# Patient Record
Sex: Female | Born: 1950 | Race: Black or African American | Hispanic: No | Smoking: Never smoker
Health system: Southern US, Community
[De-identification: ages and names within clinical notes are randomized; demographics above are authoritative.]

## PROBLEM LIST (undated history)

## (undated) DIAGNOSIS — E785 Hyperlipidemia, unspecified: Secondary | ICD-10-CM

## (undated) HISTORY — PX: KIDNEY DONATION: SHX685

## (undated) HISTORY — PX: OTHER SURGICAL HISTORY: SHX169

---

## 1999-04-03 ENCOUNTER — Encounter: Payer: Self-pay | Admitting: *Deleted

## 1999-04-03 ENCOUNTER — Encounter: Admission: RE | Admit: 1999-04-03 | Discharge: 1999-04-03 | Payer: Self-pay | Admitting: *Deleted

## 1999-04-21 ENCOUNTER — Encounter: Payer: Self-pay | Admitting: *Deleted

## 1999-04-21 ENCOUNTER — Encounter: Admission: RE | Admit: 1999-04-21 | Discharge: 1999-04-21 | Payer: Self-pay | Admitting: *Deleted

## 2000-03-22 ENCOUNTER — Other Ambulatory Visit: Admission: RE | Admit: 2000-03-22 | Discharge: 2000-03-22 | Payer: Self-pay | Admitting: *Deleted

## 2000-04-07 ENCOUNTER — Encounter: Admission: RE | Admit: 2000-04-07 | Discharge: 2000-04-07 | Payer: Self-pay | Admitting: *Deleted

## 2000-04-07 ENCOUNTER — Encounter: Payer: Self-pay | Admitting: *Deleted

## 2001-04-11 ENCOUNTER — Other Ambulatory Visit: Admission: RE | Admit: 2001-04-11 | Discharge: 2001-04-11 | Payer: Self-pay | Admitting: Obstetrics and Gynecology

## 2001-04-11 ENCOUNTER — Encounter: Admission: RE | Admit: 2001-04-11 | Discharge: 2001-04-11 | Payer: Self-pay | Admitting: *Deleted

## 2001-04-11 ENCOUNTER — Encounter: Payer: Self-pay | Admitting: *Deleted

## 2002-02-22 ENCOUNTER — Encounter: Payer: Self-pay | Admitting: Internal Medicine

## 2002-02-22 ENCOUNTER — Encounter: Admission: RE | Admit: 2002-02-22 | Discharge: 2002-02-22 | Payer: Self-pay | Admitting: Internal Medicine

## 2003-05-07 ENCOUNTER — Ambulatory Visit (HOSPITAL_COMMUNITY): Admission: RE | Admit: 2003-05-07 | Discharge: 2003-05-07 | Payer: Self-pay | Admitting: Internal Medicine

## 2003-06-11 ENCOUNTER — Encounter: Admission: RE | Admit: 2003-06-11 | Discharge: 2003-06-11 | Payer: Self-pay | Admitting: Internal Medicine

## 2004-03-03 ENCOUNTER — Encounter: Payer: Self-pay | Admitting: Internal Medicine

## 2004-03-13 ENCOUNTER — Encounter: Payer: Self-pay | Admitting: Internal Medicine

## 2004-04-13 ENCOUNTER — Encounter: Payer: Self-pay | Admitting: Internal Medicine

## 2004-09-18 ENCOUNTER — Encounter: Admission: RE | Admit: 2004-09-18 | Discharge: 2004-09-18 | Payer: Self-pay | Admitting: Internal Medicine

## 2004-10-28 ENCOUNTER — Ambulatory Visit: Payer: Self-pay

## 2005-06-02 ENCOUNTER — Ambulatory Visit: Payer: Self-pay | Admitting: Orthopedic Surgery

## 2005-10-26 ENCOUNTER — Encounter: Admission: RE | Admit: 2005-10-26 | Discharge: 2005-10-26 | Payer: Self-pay | Admitting: Family Medicine

## 2006-02-02 ENCOUNTER — Encounter
Admission: RE | Admit: 2006-02-02 | Discharge: 2006-03-16 | Payer: Self-pay | Admitting: Physical Medicine & Rehabilitation

## 2006-12-27 ENCOUNTER — Encounter: Admission: RE | Admit: 2006-12-27 | Discharge: 2006-12-27 | Payer: Self-pay | Admitting: Family Medicine

## 2006-12-31 ENCOUNTER — Ambulatory Visit (HOSPITAL_COMMUNITY): Admission: RE | Admit: 2006-12-31 | Discharge: 2006-12-31 | Payer: Self-pay | Admitting: Family Medicine

## 2008-04-21 ENCOUNTER — Emergency Department (HOSPITAL_COMMUNITY): Admission: EM | Admit: 2008-04-21 | Discharge: 2008-04-21 | Payer: Self-pay | Admitting: Family Medicine

## 2009-03-12 ENCOUNTER — Emergency Department (HOSPITAL_COMMUNITY): Admission: EM | Admit: 2009-03-12 | Discharge: 2009-03-12 | Payer: Self-pay | Admitting: Family Medicine

## 2009-07-03 ENCOUNTER — Encounter: Admission: RE | Admit: 2009-07-03 | Discharge: 2009-07-03 | Payer: Self-pay | Admitting: Family Medicine

## 2010-05-04 ENCOUNTER — Encounter: Payer: Self-pay | Admitting: Family Medicine

## 2011-03-21 ENCOUNTER — Encounter: Payer: Self-pay | Admitting: *Deleted

## 2011-03-21 ENCOUNTER — Emergency Department (HOSPITAL_COMMUNITY): Payer: PRIVATE HEALTH INSURANCE

## 2011-03-21 ENCOUNTER — Emergency Department (HOSPITAL_COMMUNITY)
Admission: EM | Admit: 2011-03-21 | Discharge: 2011-03-21 | Disposition: A | Discharge status: Home / Self Care | Payer: PRIVATE HEALTH INSURANCE | Attending: Emergency Medicine | Admitting: Emergency Medicine

## 2011-03-21 DIAGNOSIS — B9789 Other viral agents as the cause of diseases classified elsewhere: Secondary | ICD-10-CM | POA: Insufficient documentation

## 2011-03-21 DIAGNOSIS — B349 Viral infection, unspecified: Secondary | ICD-10-CM

## 2011-03-21 DIAGNOSIS — J029 Acute pharyngitis, unspecified: Secondary | ICD-10-CM | POA: Insufficient documentation

## 2011-03-21 HISTORY — DX: Hyperlipidemia, unspecified: E78.5

## 2011-03-21 MED ORDER — ACETAMINOPHEN 325 MG PO TABS
650.0000 mg | ORAL_TABLET | Freq: Once | ORAL | Status: AC
  Administered 2011-03-21: 650 mg via ORAL
  Filled 2011-03-21: qty 2

## 2011-03-21 MED ORDER — HYDROCOD POLST-CHLORPHEN POLST 10-8 MG/5ML PO LQCR: 5.0000 mL | Freq: Two times a day (BID) | ORAL | Status: DC | PRN

## 2011-03-21 NOTE — ED Provider Notes (Signed)
History     CSN: [id number] Arrival date & time: 03/21/2011  6:52 PM   First MD Initiated Contact with Patient 03/21/11 1928      Chief Complaint  Patient presents with  . Cough  . Sore Throat    (Consider location/radiation/quality/duration/timing/severity/associated sxs/prior treatment) HPI Comments: Patient has been seen by her PCP for this same complaint.  She was prescribed Jerilynn Som, which she reports provided her  minimal relief.  Patient reports that she was sent to the ED by her PCP to get a chest xray.    Patient is a 60 y.o. female presenting with cough. The history is provided by the patient.  Cough This is a new problem. The current episode started more than 1 week ago. The problem has been gradually worsening. The cough is non-productive. The maximum temperature recorded prior to her arrival was 100 to 100.9 F. Associated symptoms include rhinorrhea, sore throat and myalgias. Pertinent negatives include no chest pain, no chills, no weight loss, no ear congestion, no ear pain, no headaches, no shortness of breath, no wheezing and no eye redness. She is not a smoker. Her past medical history does not include COPD or asthma.    Past Medical History  Diagnosis Date  . Hyperlipidemia     LDL elevated    Past Surgical History  Procedure Date  . Kidney donation   . Carpel tunnell release     No family history on file.  History  Substance Use Topics  . Smoking status: Never Smoker   . Smokeless tobacco: Not on file  . Alcohol Use: No    OB History    Grav Para Term Preterm Abortions TAB SAB Ect Mult Living                  Review of Systems  Constitutional: Positive for appetite change and fatigue. Negative for chills, weight loss, diaphoresis and unexpected weight change.  HENT: Positive for congestion, sore throat and rhinorrhea. Negative for ear pain, neck pain and neck stiffness.   Eyes: Negative for redness and visual disturbance.  Respiratory:  Positive for cough. Negative for chest tightness, shortness of breath and wheezing.   Cardiovascular: Negative for chest pain.  Gastrointestinal: Negative for nausea, vomiting and abdominal pain.  Genitourinary: Negative for dysuria, hematuria, decreased urine volume, vaginal bleeding and vaginal discharge.  Musculoskeletal: Positive for myalgias.  Skin: Negative for rash.  Neurological: Negative for dizziness, syncope, light-headedness and headaches.  Psychiatric/Behavioral: Negative for confusion.    Allergies  Review of patient's allergies indicates no known allergies.  Home Medications   Current Outpatient Rx  Name Route Sig Dispense Refill  . THERA M PLUS PO TABS Oral Take 1 tablet by mouth daily.        BP 130/88  Pulse 100  Temp(Src) 99.7 F (37.6 C) (Oral)  Resp 16  SpO2 100%  Physical Exam  Constitutional: She is oriented to person, place, and time. She appears well-developed and well-nourished.  HENT:  Head: Normocephalic and atraumatic.  Eyes: EOM are normal. Pupils are equal, round, and reactive to light.  Neck: Normal range of motion. Neck supple.  Cardiovascular: Normal rate, regular rhythm and normal heart sounds.   Pulmonary/Chest: Effort normal and breath sounds normal. No respiratory distress. She has no wheezes. She has no rales. She exhibits no tenderness.  Abdominal: Soft. Bowel sounds are normal. She exhibits no distension. There is no tenderness.  Musculoskeletal: Normal range of motion.  Neurological: She is alert  and oriented to person, place, and time.  Skin: Skin is warm and dry.  Psychiatric: She has a normal mood and affect.    ED Course  Procedures (including critical care time)  Labs Reviewed - No data to display Dg Chest 2 View  03/21/2011  *RADIOLOGY REPORT*  Clinical Data: Cough and fever.  CHEST - 2 VIEW  Comparison: None.  Findings: The heart size is normal.  The lungs are clear.  The visualized soft tissues and bony thorax are  unremarkable.  IMPRESSION: Negative chest.  Original Report Authenticated By: Jamesetta Orleans. MATTERN, M.D.     1. Viral illness       MDM  Negative CXR..  VSS.  Patient is not hypoxic.  No respiratory distress.  Therefore, feel that patient can be safely discharged home with PCP follow up.  Patient given a Rx for Tussinex for the cough.  Patient in agreement with the plan.         Pascal Lux [facility] 03/22/11 1325

## 2011-03-21 NOTE — ED Notes (Signed)
Pt has had cough for greater than 3 weeks. Saw MD at work who gave her Tessalon pearls with some improvement. Suggested CXR. Pt does not have PCP, comes to [facility] ED for treatment. Also notes scratchy through along with cough.

## 2011-03-22 NOTE — ED Provider Notes (Signed)
Medical screening examination/treatment/procedure(s) were performed by non-physician practitioner and as supervising physician I was immediately available for consultation/collaboration.  Nandan Willems P Izela Altier, MD 03/22/11 1528 

## 2011-11-18 ENCOUNTER — Emergency Department: Payer: Self-pay | Admitting: Emergency Medicine

## 2011-11-18 LAB — COMPREHENSIVE METABOLIC PANEL
Bilirubin,Total: 0.9 mg/dL (ref 0.2–1.0)
Chloride: 108 mmol/L — ABNORMAL HIGH (ref 98–107)
Co2: 26 mmol/L (ref 21–32)
Creatinine: 1 mg/dL (ref 0.60–1.30)
EGFR (African American): >60
EGFR (Non-African Amer.): >60
SGOT(AST): 42 U/L — ABNORMAL HIGH (ref 15–37)
SGPT (ALT): 28 U/L (ref 12–78)

## 2011-11-18 LAB — CBC
HCT: 39.4 % (ref 35.0–47.0)
MCV: 84 fL (ref 80–100)
Platelet: 255 10*3/uL (ref 150–440)
RBC: 4.7 10*6/uL (ref 3.80–5.20)
WBC: 16.8 10*3/uL — ABNORMAL HIGH (ref 3.6–11.0)

## 2011-11-18 LAB — APTT: Activated PTT: 29.1 secs (ref 23.6–35.9)

## 2012-06-16 ENCOUNTER — Ambulatory Visit: Payer: PRIVATE HEALTH INSURANCE | Attending: Student | Admitting: Physical Therapy

## 2012-06-16 DIAGNOSIS — M25569 Pain in unspecified knee: Secondary | ICD-10-CM | POA: Insufficient documentation

## 2012-06-16 DIAGNOSIS — IMO0001 Reserved for inherently not codable concepts without codable children: Secondary | ICD-10-CM | POA: Insufficient documentation

## 2012-06-21 ENCOUNTER — Ambulatory Visit: Payer: PRIVATE HEALTH INSURANCE | Admitting: Physical Therapy

## 2012-06-22 ENCOUNTER — Ambulatory Visit: Payer: PRIVATE HEALTH INSURANCE | Admitting: Physical Therapy

## 2012-06-28 ENCOUNTER — Ambulatory Visit: Payer: PRIVATE HEALTH INSURANCE | Admitting: Physical Therapy

## 2012-06-30 ENCOUNTER — Ambulatory Visit: Payer: PRIVATE HEALTH INSURANCE | Admitting: Physical Therapy

## 2012-07-04 ENCOUNTER — Ambulatory Visit: Payer: PRIVATE HEALTH INSURANCE | Admitting: Physical Therapy

## 2012-07-05 ENCOUNTER — Ambulatory Visit: Payer: PRIVATE HEALTH INSURANCE | Admitting: Physical Therapy

## 2012-07-12 ENCOUNTER — Ambulatory Visit: Payer: PRIVATE HEALTH INSURANCE | Attending: Student | Admitting: Physical Therapy

## 2012-07-12 DIAGNOSIS — M25569 Pain in unspecified knee: Secondary | ICD-10-CM | POA: Insufficient documentation

## 2012-07-12 DIAGNOSIS — IMO0001 Reserved for inherently not codable concepts without codable children: Secondary | ICD-10-CM | POA: Insufficient documentation

## 2012-07-14 ENCOUNTER — Ambulatory Visit: Payer: PRIVATE HEALTH INSURANCE | Admitting: Physical Therapy

## 2012-07-19 ENCOUNTER — Ambulatory Visit: Payer: PRIVATE HEALTH INSURANCE | Admitting: Physical Therapy

## 2012-07-21 ENCOUNTER — Ambulatory Visit: Payer: PRIVATE HEALTH INSURANCE | Admitting: Physical Therapy

## 2012-07-26 ENCOUNTER — Ambulatory Visit: Payer: PRIVATE HEALTH INSURANCE | Admitting: Physical Therapy

## 2012-07-28 ENCOUNTER — Ambulatory Visit: Payer: PRIVATE HEALTH INSURANCE | Admitting: Physical Therapy

## 2012-10-06 ENCOUNTER — Ambulatory Visit: Payer: PRIVATE HEALTH INSURANCE | Attending: Student | Admitting: Physical Therapy

## 2012-10-06 DIAGNOSIS — IMO0001 Reserved for inherently not codable concepts without codable children: Secondary | ICD-10-CM | POA: Insufficient documentation

## 2012-10-06 DIAGNOSIS — M25569 Pain in unspecified knee: Secondary | ICD-10-CM | POA: Insufficient documentation

## 2012-10-18 ENCOUNTER — Ambulatory Visit: Payer: PRIVATE HEALTH INSURANCE | Attending: Student | Admitting: Physical Therapy

## 2012-10-18 DIAGNOSIS — IMO0001 Reserved for inherently not codable concepts without codable children: Secondary | ICD-10-CM | POA: Insufficient documentation

## 2012-10-18 DIAGNOSIS — M25569 Pain in unspecified knee: Secondary | ICD-10-CM | POA: Insufficient documentation

## 2012-10-20 ENCOUNTER — Ambulatory Visit: Payer: PRIVATE HEALTH INSURANCE | Admitting: Physical Therapy

## 2012-10-25 ENCOUNTER — Ambulatory Visit: Payer: PRIVATE HEALTH INSURANCE | Admitting: Physical Therapy

## 2012-10-27 ENCOUNTER — Ambulatory Visit: Payer: PRIVATE HEALTH INSURANCE | Admitting: Physical Therapy

## 2012-11-01 ENCOUNTER — Encounter: Payer: PRIVATE HEALTH INSURANCE | Admitting: Physical Therapy

## 2012-11-03 ENCOUNTER — Encounter: Payer: PRIVATE HEALTH INSURANCE | Admitting: Physical Therapy

## 2016-06-03 ENCOUNTER — Ambulatory Visit: Payer: Self-pay | Admitting: Physician Assistant

## 2016-06-03 ENCOUNTER — Encounter: Payer: Self-pay | Admitting: Physician Assistant

## 2016-06-03 VITALS — BP 150/80 | HR 90 | Temp 98.6°F

## 2016-06-03 DIAGNOSIS — J069 Acute upper respiratory infection, unspecified: Secondary | ICD-10-CM

## 2016-06-03 MED ORDER — AZITHROMYCIN 250 MG PO TABS: ORAL_TABLET | ORAL | 0 refills | Status: DC

## 2016-06-03 MED ORDER — BENZONATATE 200 MG PO CAPS: 200.0000 mg | ORAL_CAPSULE | Freq: Two times a day (BID) | ORAL | 0 refills | Status: DC | PRN

## 2016-06-03 NOTE — Progress Notes (Signed)
S: C/o cough, sore throat and congestion for 3 days, no fever, chills, cp/sob, v/d; mucus was clear and brown this am , cough is dry and hacking, no wheezing   Using otc meds: otc cough med  O: PE: vitals wnl,nad, perrl eomi, normocephalic, tms dull, nasal mucosa red and swollen, throat injected, neck supple no lymph, lungs c t a, cv rrr, neuro intact  A:  Acute uri   P: drink fluids, continue regular meds , use otc meds of choice, return if not improving in 5 days, return earlier if worsening , zpack, tessalon perls

## 2016-06-22 ENCOUNTER — Encounter: Payer: Self-pay | Admitting: Physician Assistant

## 2016-06-22 ENCOUNTER — Ambulatory Visit: Payer: Self-pay | Admitting: Physician Assistant

## 2016-06-22 VITALS — BP 140/90 | HR 60 | Temp 97.9°F

## 2016-06-22 DIAGNOSIS — J069 Acute upper respiratory infection, unspecified: Secondary | ICD-10-CM

## 2016-06-22 MED ORDER — METHYLPREDNISOLONE 4 MG PO TBPK: ORAL_TABLET | ORAL | 0 refills | Status: DC

## 2016-06-22 NOTE — Progress Notes (Signed)
S: c/o continued cough since 2-21; states she finished her zpack but the dry cough won't stop, no fever/chills/cp/sob, no known wheezing, states she gets very little mucus out  O: vitals wnl, nad, ent wnl, neck supple no lymph, lungs c t a, cv rrr  A: acute uri   P: medrol dose pack, if not better in 2 days will rx inhaler and ?antibiotic

## 2016-09-02 ENCOUNTER — Other Ambulatory Visit: Payer: Self-pay | Admitting: Physician Assistant

## 2016-09-03 NOTE — Telephone Encounter (Signed)
Med refill approved 

## 2017-04-19 ENCOUNTER — Ambulatory Visit
Admission: EM | Admit: 2017-04-19 | Discharge: 2017-04-19 | Disposition: A | Discharge status: Home / Self Care | Payer: Self-pay | Attending: Family Medicine | Admitting: Family Medicine

## 2017-04-19 ENCOUNTER — Other Ambulatory Visit: Payer: Self-pay

## 2017-04-19 ENCOUNTER — Encounter: Payer: Self-pay | Admitting: Emergency Medicine

## 2017-04-19 DIAGNOSIS — R05 Cough: Secondary | ICD-10-CM

## 2017-04-19 DIAGNOSIS — J069 Acute upper respiratory infection, unspecified: Secondary | ICD-10-CM

## 2017-04-19 DIAGNOSIS — J029 Acute pharyngitis, unspecified: Secondary | ICD-10-CM

## 2017-04-19 DIAGNOSIS — R059 Cough, unspecified: Secondary | ICD-10-CM

## 2017-04-19 LAB — RAPID STREP SCREEN (MED CTR MEBANE ONLY): STREPTOCOCCUS, GROUP A SCREEN (DIRECT): NEGATIVE

## 2017-04-19 MED ORDER — DOXYCYCLINE HYCLATE 100 MG PO CAPS: 100.0000 mg | ORAL_CAPSULE | Freq: Two times a day (BID) | ORAL | 0 refills | Status: DC

## 2017-04-19 MED ORDER — BENZONATATE 100 MG PO CAPS: 100.0000 mg | ORAL_CAPSULE | Freq: Three times a day (TID) | ORAL | 0 refills | Status: DC | PRN

## 2017-04-19 MED ORDER — GUAIFENESIN-CODEINE 100-10 MG/5ML PO SOLN: 5.0000 mL | Freq: Every evening | ORAL | 0 refills | Status: DC | PRN

## 2017-04-19 NOTE — ED Triage Notes (Signed)
Patient c/o HA, cough, nasal drainage, and chest congestion since Tuesday.  Patient denies fevers.

## 2017-04-19 NOTE — ED Provider Notes (Signed)
[facility] ____________________________________________  Time seen: Approximately 09:12 AM  I have reviewed the triage vital signs and the nursing notes.   HISTORY  Chief Complaint Cough   HPI Barbara Powers is a 67 y.o. female presenting for evaluation of 1 week of nasal congestion, sore throat, cough and chest congestion.  States cough is Occasionally productive, mostly dry hacking cough.  States cough is disrupting her sleep.  States symptoms initially started with sore throat, but reports other symptoms quickly followed.  States sore throat has continued and it does hurt to eat and drink, but continues to overall eat and drink well.  Denies any accompanying fevers.  Denies home sick contacts.  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  States had some chills and warm sensation, denies known fevers.  States of the last 2-3 days has been resting more and symptoms have continued.  States initially she continue to remain very active.  States sore throat currently mild to moderate.  States does have some soreness to her sides from coughing, denies chest pain or shortness of breath.  Denies hemoptysis.  States symptoms have been unresolved with over-the-counter Mucinex and lozenges.  States that she has a previous kidney donor with nephrectomy.  Denies renal insufficiency.  Denies cardiac history.  Denies chest pain, shortness of breath, abdominal pain, dysuria, extremity pain, extremity swelling or rash. Denies recent sickness. Denies recent antibiotic use.    Past Medical History:  Diagnosis Date  . Hyperlipidemia    LDL elevated    There are no active problems to display for this patient.   Past Surgical History:  Procedure Laterality Date  . carpel tunnell release    . KIDNEY DONATION       No current facility-administered medications for this encounter.   Current Outpatient Medications:  .   esomeprazole (NEXIUM) 20 MG capsule, Take 20 mg by mouth daily at 12 noon., Disp: , Rfl:  .  fluticasone (FLONASE) 50 MCG/ACT nasal spray, USE 2 SPRAYS IN EACH NOSTRIL EVERY DAY, Disp: 16 g, Rfl: 12 .  Multiple Vitamins-Minerals (MULTIVITAMINS THER. W/MINERALS) TABS, Take 1 tablet by mouth daily.  , Disp: , Rfl:  .  benzonatate (TESSALON PERLES) 100 MG capsule, Take 1 capsule (100 mg total) by mouth 3 (three) times daily as needed for cough., Disp: 15 capsule, Rfl: 0 .  doxycycline (VIBRAMYCIN) 100 MG capsule, Take 1 capsule (100 mg total) by mouth 2 (two) times daily., Disp: 20 capsule, Rfl: 0 .  guaiFENesin-codeine 100-10 MG/5ML syrup, Take 5 mLs by mouth at bedtime as needed for cough. Do not drive or operate machinery as can cause drowsiness., Disp: 75 mL, Rfl: 0  Allergies Amoxicillin and Latex  History reviewed. No pertinent family history.  Social History Social History   Tobacco Use  . Smoking status: Never Smoker  . Smokeless tobacco: Never Used  Substance Use Topics  . Alcohol use: No  . Drug use: Not on file    Review of Systems Constitutional: No fever/chills ENT: Positive sore throat. Cardiovascular: Denies chest pain. Respiratory: Denies shortness of breath. Gastrointestinal: No abdominal pain.   Musculoskeletal: Negative for back pain. Skin: Negative for rash.  ____________________________________________   PHYSICAL EXAM:  VITAL SIGNS: ED Triage Vitals  Enc Vitals Group     BP 04/19/17 0859 120/60     Pulse Rate 04/19/17 0859 95     Resp 04/19/17 0859 16  Temp 04/19/17 0859 99.2 F (37.3 C)     Temp Source 04/19/17 0859 Oral     SpO2 04/19/17 0859 97 %     Weight 04/19/17 0856 192 lb (87.1 kg)     Height 04/19/17 0856 5\' 6"  (1.676 m)     Head Circumference --      Peak Flow --      Pain Score 04/19/17 0856 7     Pain Loc --      Pain Edu? --      Excl. in GC? --     Constitutional: Alert and oriented. Well appearing and in no acute  distress. Eyes: Conjunctivae are normal. Head: Atraumatic. No sinus tenderness to palpation. No swelling. No erythema.  Ears: no erythema, normal TMs bilaterally.   Nose:Nasal congestion  Mouth/Throat: Mucous membranes are moist. Mild to moderate pharyngeal erythema. No tonsillar swelling or exudate.  Neck: No stridor.  No cervical spine tenderness to palpation. Hematological/Lymphatic/Immunilogical: No cervical lymphadenopathy. Cardiovascular: Normal rate, regular rhythm. Grossly normal heart sounds.  Good peripheral circulation. Respiratory: Normal respiratory effort.  No retractions. No wheezes.  Mild scattered rhonchi.  Dry intermittent cough noted in room.  Speaks in complete sentences.  Good air movement.  Musculoskeletal: Ambulatory with steady gait.  Neurologic:  Normal speech and language. No gait instability. Skin:  Skin appears warm, dry and intact. No rash noted. Psychiatric: Mood and affect are normal. Speech and behavior are normal.  ___________________________________________   LABS (all labs ordered are listed, but only abnormal results are displayed)  Labs Reviewed  RAPID STREP SCREEN (NOT AT [facility])  CULTURE, GROUP A STREP [facility])    PROCEDURES Procedures   INITIAL IMPRESSION / ASSESSMENT AND PLAN / ED COURSE  Pertinent labs & imaging results that were available during my care of the patient were reviewed by me and considered in my medical decision making (see chart for details).  Well-appearing patient.  No acute distress.  Suspect recent viral upper respiratory infection.  Quick strep negative, will culture.  Will start patient on oral doxycycline, as needed guaifenesin with codeine at night and PRN Tessalon Perles.  Encourage rest, fluids, supportive care.Discussed indication, risks and benefits of medications with patient.  Discussed follow up with Primary care physician this week. Discussed follow up and return parameters including no resolution or any worsening  concerns. Patient verbalized understanding and agreed to plan.   ____________________________________________   FINAL CLINICAL IMPRESSION(S) / ED DIAGNOSES  Final diagnoses:  Cough  Upper respiratory tract infection, unspecified type     ED Discharge Orders        Ordered    doxycycline (VIBRAMYCIN) 100 MG capsule  2 times daily     04/19/17 0949    guaiFENesin-codeine 100-10 MG/5ML syrup  At bedtime PRN     04/19/17 0949    benzonatate (TESSALON PERLES) 100 MG capsule  3 times daily PRN     04/19/17 0949       Note: This dictation was prepared with Dragon dictation along with smaller phrase technology. Any transcriptional errors that result from this process are unintentional.         Renford Dills, NP 04/19/17 1016

## 2017-04-19 NOTE — Discharge Instructions (Signed)
Take medication as prescribed. Rest. Drink plenty of fluids.  ° °Follow up with your primary care physician this week as needed. Return to Urgent care for new or worsening concerns.  ° °

## 2017-04-22 LAB — CULTURE, GROUP A STREP (THRC)

## 2017-05-10 ENCOUNTER — Ambulatory Visit: Payer: Self-pay | Admitting: Emergency Medicine

## 2017-05-10 VITALS — BP 144/76 | HR 105 | Temp 98.6°F

## 2017-05-10 DIAGNOSIS — R059 Cough, unspecified: Secondary | ICD-10-CM

## 2017-05-10 DIAGNOSIS — R05 Cough: Secondary | ICD-10-CM

## 2017-05-10 DIAGNOSIS — K219 Gastro-esophageal reflux disease without esophagitis: Secondary | ICD-10-CM

## 2017-05-10 LAB — POCT RAPID STREP A (OFFICE): RAPID STREP A SCREEN: NEGATIVE

## 2017-05-10 MED ORDER — ESOMEPRAZOLE MAGNESIUM 20 MG PO CPDR: 20.0000 mg | DELAYED_RELEASE_CAPSULE | Freq: Every day | ORAL | 1 refills | Status: AC

## 2017-05-10 NOTE — Progress Notes (Signed)
Subjective. . Patient was in her usual state of health until last night when she had the onset of a paroxysmal cough. This appears to be coming from her upper chest. She also has been struggling with reflux esophagitis. She takes Nexium about every 3 days. She denies fevers chills or nasal congestion. Review of systems. She is a kidney donor. Objective. Alert cooperative in no distress. TMs are clear. Nose normal. Throat is minimally red. Neck is supple without adenopathy. Chest is clear to auscultation. Abdomen soft no upper abdominal discomfort. Assessment. I suspect his cough is coming from reflux. She has been having significant food intolerance and only takes her Nexium every 3 days. She does not appear ill today. Plan Take Nexium every day prescription sent in. She has a physical coming up in 10 days and will discuss the symptoms with her PCP. She can take a half teaspoon of cough medicine at night. She has Tessalon Perles to take as needed. She was recently treated with a 10 day course of doxycycline.

## 2017-05-10 NOTE — Patient Instructions (Signed)
Gastroesophageal Reflux Disease, Adult Normally, food travels down the esophagus and stays in the stomach to be digested. However, when a person has gastroesophageal reflux disease (GERD), food and stomach acid move back up into the esophagus. When this happens, the esophagus becomes sore and inflamed. Over time, GERD can create small holes (ulcers) in the lining of the esophagus. What are the causes? This condition is caused by a problem with the muscle between the esophagus and the stomach (lower esophageal sphincter, or LES). Normally, the LES muscle closes after food passes through the esophagus to the stomach. When the LES is weakened or abnormal, it does not close properly, and that allows food and stomach acid to go back up into the esophagus. The LES can be weakened by certain dietary substances, medicines, and medical conditions, including:  Tobacco use.  Pregnancy.  Having a hiatal hernia.  Heavy alcohol use.  Certain foods and beverages, such as coffee, chocolate, onions, and peppermint.  What increases the risk? This condition is more likely to develop in:  People who have an increased body weight.  People who have connective tissue disorders.  People who use NSAID medicines.  What are the signs or symptoms? Symptoms of this condition include:  Heartburn.  Difficult or painful swallowing.  The feeling of having a lump in the throat.  Abitter taste in the mouth.  Bad breath.  Having a large amount of saliva.  Having an upset or bloated stomach.  Belching.  Chest pain.  Shortness of breath or wheezing.  Ongoing (chronic) cough or a night-time cough.  Wearing away of tooth enamel.  Weight loss.  Different conditions can cause chest pain. Make sure to see your health care provider if you experience chest pain. How is this diagnosed? Your health care provider will take a medical history and perform a physical exam. To determine if you have mild or severe  GERD, your health care provider may also monitor how you respond to treatment. You may also have other tests, including:  An endoscopy toexamine your stomach and esophagus with a small camera.  A test thatmeasures the acidity level in your esophagus.  A test thatmeasures how much pressure is on your esophagus.  A barium swallow or modified barium swallow to show the shape, size, and functioning of your esophagus.  How is this treated? The goal of treatment is to help relieve your symptoms and to prevent complications. Treatment for this condition may vary depending on how severe your symptoms are. Your health care provider may recommend:  Changes to your diet.  Medicine.  Surgery.  Follow these instructions at home: Diet  Follow a diet as recommended by your health care provider. This may involve avoiding foods and drinks such as: ? Coffee and tea (with or without caffeine). ? Drinks that containalcohol. ? Energy drinks and sports drinks. ? Carbonated drinks or sodas. ? Chocolate and cocoa. ? Peppermint and mint flavorings. ? Garlic and onions. ? Horseradish. ? Spicy and acidic foods, including peppers, chili powder, curry powder, vinegar, hot sauces, and barbecue sauce. ? Citrus fruit juices and citrus fruits, such as oranges, lemons, and limes. ? Tomato-based foods, such as red sauce, chili, salsa, and pizza with red sauce. ? Fried and fatty foods, such as donuts, french fries, potato chips, and high-fat dressings. ? High-fat meats, such as hot dogs and fatty cuts of red and white meats, such as rib eye steak, sausage, ham, and bacon. ? High-fat dairy items, such as whole milk,   butter, and cream cheese.  Eat small, frequent meals instead of large meals.  Avoid drinking large amounts of liquid with your meals.  Avoid eating meals during the 2-3 hours before bedtime.  Avoid lying down right after you eat.  Do not exercise right after you eat. General  instructions  Pay attention to any changes in your symptoms.  Take over-the-counter and prescription medicines only as told by your health care provider. Do not take aspirin, ibuprofen, or other NSAIDs unless your health care provider told you to do so.  Do not use any tobacco products, including cigarettes, chewing tobacco, and e-cigarettes. If you need help quitting, ask your health care provider.  Wear loose-fitting clothing. Do not wear anything tight around your waist that causes pressure on your abdomen.  Raise (elevate) the head of your bed 6 inches (15cm).  Try to reduce your stress, such as with yoga or meditation. If you need help reducing stress, ask your health care provider.  If you are overweight, reduce your weight to an amount that is healthy for you. Ask your health care provider for guidance about a safe weight loss goal.  Keep all follow-up visits as told by your health care provider. This is important. Contact a health care provider if:  You have new symptoms.  You have unexplained weight loss.  You have difficulty swallowing, or it hurts to swallow.  You have wheezing or a persistent cough.  Your symptoms do not improve with treatment.  You have a hoarse voice. Get help right away if:  You have pain in your arms, neck, jaw, teeth, or back.  You feel sweaty, dizzy, or light-headed.  You have chest pain or shortness of breath.  You vomit and your vomit looks like blood or coffee grounds.  You faint.  Your stool is bloody or black.  You cannot swallow, drink, or eat. This information is not intended to replace advice given to you by your health care provider. Make sure you discuss any questions you have with your health care provider. Document Released: 01/07/2005 Document Revised: 08/28/2015 Document Reviewed: 07/25/2014 Elsevier Interactive Patient Education  2018 [employer]  

## 2017-05-10 NOTE — Addendum Note (Signed)
Addended by: Catha BrowEACON, [place]an Base T on: 05/10/2017 04:07 PM   Modules accepted: Orders

## 2018-02-25 ENCOUNTER — Encounter: Payer: Self-pay | Admitting: Podiatry

## 2018-02-25 ENCOUNTER — Ambulatory Visit: Payer: Medicare Other | Admitting: Podiatry

## 2018-02-25 ENCOUNTER — Ambulatory Visit (INDEPENDENT_AMBULATORY_CARE_PROVIDER_SITE_OTHER): Payer: Medicare Other

## 2018-02-25 ENCOUNTER — Ambulatory Visit: Payer: Self-pay | Admitting: Podiatry

## 2018-02-25 ENCOUNTER — Other Ambulatory Visit: Payer: Self-pay

## 2018-02-25 VITALS — BP 133/65 | HR 84 | Resp 16

## 2018-02-25 DIAGNOSIS — M2550 Pain in unspecified joint: Secondary | ICD-10-CM

## 2018-02-25 DIAGNOSIS — M179 Osteoarthritis of knee, unspecified: Secondary | ICD-10-CM | POA: Insufficient documentation

## 2018-02-25 DIAGNOSIS — M7741 Metatarsalgia, right foot: Secondary | ICD-10-CM

## 2018-02-25 DIAGNOSIS — M7742 Metatarsalgia, left foot: Secondary | ICD-10-CM | POA: Diagnosis not present

## 2018-02-25 DIAGNOSIS — M779 Enthesopathy, unspecified: Secondary | ICD-10-CM | POA: Diagnosis not present

## 2018-02-25 DIAGNOSIS — M1710 Unilateral primary osteoarthritis, unspecified knee: Secondary | ICD-10-CM | POA: Insufficient documentation

## 2018-02-25 DIAGNOSIS — S8000XA Contusion of unspecified knee, initial encounter: Secondary | ICD-10-CM | POA: Insufficient documentation

## 2018-02-25 DIAGNOSIS — M778 Other enthesopathies, not elsewhere classified: Secondary | ICD-10-CM

## 2018-02-25 MED ORDER — MELOXICAM 15 MG PO TABS: 15.0000 mg | ORAL_TABLET | Freq: Every day | ORAL | 0 refills | Status: AC

## 2018-02-25 NOTE — Progress Notes (Signed)
Subjective:    Patient ID: Barbara Powers, female    DOB: [birth year], 67 y.o.   MRN: [id number]  HPI 68 year old female presents the office today with concerns of pain to her right forefoot worse than the left but spent on the last 4 months.  At times she describes the pain 8 out of 10 and discussed that sharp pain appears worse in the morning or late in the afternoon.  She denies any recent injury or trauma.  She is tried taking Tylenol.  All the pain is been on the last 6 months she has had the numbness and tingling for the last 2 to 4 months. She denies any history of systemic arthritis but she does not think she has ever been checked.  She has a history of bunion surgery bilaterally.   Review of Systems  Musculoskeletal: Positive for arthralgias and myalgias.  All other systems reviewed and are negative.  Past Medical History:  Diagnosis Date  . Hyperlipidemia    LDL elevated    Past Surgical History:  Procedure Laterality Date  . carpel tunnell release    . KIDNEY DONATION       Current Outpatient Medications:  .  esomeprazole (NEXIUM) 20 MG capsule, Take 1 capsule (20 mg total) by mouth daily at 12 noon., Disp: 30 capsule, Rfl: 1 .  fluticasone (FLONASE) 50 MCG/ACT nasal spray, USE 2 SPRAYS IN EACH NOSTRIL EVERY DAY, Disp: 16 g, Rfl: 12 .  meloxicam (MOBIC) 15 MG tablet, Take 1 tablet (15 mg total) by mouth daily., Disp: 30 tablet, Rfl: 0  Allergies  Allergen Reactions  . Amoxicillin Nausea And Vomiting and Other (See Comments)    Headache  . Latex Rash        Objective:   Physical Exam  General: AAO x3, NAD  Dermatological: Incision from prior surgery well-healed.  There is no open lesions identified.  Vascular: Dorsalis Pedis artery and Posterior Tibial artery pedal pulses are 2/4 bilateral with immedate capillary fill time.  There is no pain with calf compression, swelling, warmth, erythema.   Neruologic: Grossly intact via light touch bilateral. Protective  threshold with Semmes Wienstein monofilament intact to all pedal sites bilateral.  Negative Tinel sign bilaterally.  Musculoskeletal: Upon weightbearing there is lateral deviation of all the toes on the right side.  Mild reduction first MPJ range of motion of the right first MPJ.  Minimal swelling to the first MPJs but there is no specific area pinpoint bony tenderness or pain to vibratory sensation.  Mild discomfort on the interspaces but no palpable neuroma is identified.  There is no erythema or warmth.  Muscular strength 5/5 in all groups tested bilateral.  Gait: Unassisted, Nonantalgic.     Assessment & Plan:  67 year old female capsulitis MPJs right side worse than left -Treatment options discussed including all alternatives, risks, and complications -Etiology of symptoms were discussed -X-rays were obtained and reviewed with the patient.  Status post bunion surgery bilaterally.  Slight varus present on the right first MPJ.  Lateral deviation of the toes on the right side.  No evidence of acute fracture identified today. -Prescribed mobic. Discussed side effects of the medication and directed to stop if any are to occur and call the office.  -Metatarsal offloading pads were dispensed.  Also discussed orthotics will check insurance coverage for her.  Hoping orthotic with good metatarsal support will be beneficial. -Also given the lateral deviation of the toes I would like to check a basic arthritic  panel.  Although I think her toe deviation is more from biomechanical in nature and the joint inflammation lateral deviation of the knee will be beneficial.  Vivi BarrackMatthew R Atley Neubert DPM

## 2018-02-28 LAB — C-REACTIVE PROTEIN: CRP: 2 mg/L (ref <8.0)

## 2018-02-28 LAB — ANA: Anti Nuclear Antibody(ANA): NEGATIVE

## 2018-02-28 LAB — RHEUMATOID FACTOR: Rhuematoid fact SerPl-aCnc: 25 IU/mL — ABNORMAL HIGH (ref <14)

## 2018-02-28 LAB — SEDIMENTATION RATE: Sed Rate: 11 mm/h (ref 0–30)

## 2018-03-01 ENCOUNTER — Telehealth: Payer: Self-pay | Admitting: *Deleted

## 2018-03-01 NOTE — Telephone Encounter (Signed)
-----   Message from Vivi BarrackMatthew R Wagoner, DPM sent at 02/28/2018  8:07 AM EST ----- Val- please let her know that her rheumatid factor is positive. Please let her know that this may be of no clinical signficance but given her toes deviating to the side and positive RF I would like to refer her to a rheumatologist for evaluation. If you could please arrange that as well. Thank you!

## 2018-03-01 NOTE — Telephone Encounter (Signed)
Left message for pt to call for results of her blood work.

## 2018-03-02 NOTE — Telephone Encounter (Signed)
I informed pt of Dr. Gabriel RungWagoner's review of results and orders. I explained that her labs showed that she had an inflammatory process going on and Rheumatology had more testing to define the diagnosis and treatment. Pt states [place] and [facility]. I told pt [facility] would call her and help her schedule at her convenience.

## 2018-03-03 ENCOUNTER — Other Ambulatory Visit: Payer: Medicare Other | Admitting: Orthotics

## 2018-03-28 ENCOUNTER — Other Ambulatory Visit: Payer: Medicare Other | Admitting: Orthotics

## 2019-01-25 ENCOUNTER — Other Ambulatory Visit: Payer: Self-pay

## 2019-01-25 DIAGNOSIS — Z20822 Contact with and (suspected) exposure to covid-19: Secondary | ICD-10-CM

## 2019-01-26 LAB — NOVEL CORONAVIRUS, NAA: SARS-CoV-2, NAA: NOT DETECTED

## 2020-12-17 ENCOUNTER — Other Ambulatory Visit: Payer: Self-pay

## 2020-12-17 ENCOUNTER — Emergency Department (HOSPITAL_COMMUNITY)
Admission: EM | Admit: 2020-12-17 | Discharge: 2020-12-17 | Disposition: A | Discharge status: Home / Self Care | Payer: Medicare Other | Attending: Emergency Medicine | Admitting: Emergency Medicine

## 2020-12-17 ENCOUNTER — Emergency Department (HOSPITAL_COMMUNITY): Payer: Medicare Other

## 2020-12-17 DIAGNOSIS — R112 Nausea with vomiting, unspecified: Secondary | ICD-10-CM | POA: Insufficient documentation

## 2020-12-17 DIAGNOSIS — Z91040 Latex allergy status: Secondary | ICD-10-CM | POA: Insufficient documentation

## 2020-12-17 DIAGNOSIS — R42 Dizziness and giddiness: Secondary | ICD-10-CM | POA: Diagnosis not present

## 2020-12-17 LAB — COMPREHENSIVE METABOLIC PANEL
ALT: 16 U/L (ref 0–44)
AST: 20 U/L (ref 15–41)
Albumin: 3.6 g/dL (ref 3.5–5.0)
Alkaline Phosphatase: 60 U/L (ref 38–126)
Anion gap: 6 (ref 5–15)
BUN: 17 mg/dL (ref 8–23)
CO2: 25 mmol/L (ref 22–32)
Calcium: 9.3 mg/dL (ref 8.9–10.3)
Chloride: 108 mmol/L (ref 98–111)
Creatinine, Ser: 0.94 mg/dL (ref 0.44–1.00)
GFR, Estimated: >60 mL/min (ref >60)
Glucose, Bld: 117 mg/dL — ABNORMAL HIGH (ref 70–99)
Potassium: 3.7 mmol/L (ref 3.5–5.1)
Sodium: 139 mmol/L (ref 135–145)
Total Bilirubin: 0.7 mg/dL (ref 0.3–1.2)
Total Protein: 6.7 g/dL (ref 6.5–8.1)

## 2020-12-17 LAB — URINALYSIS, ROUTINE W REFLEX MICROSCOPIC
Bacteria, UA: NONE SEEN
Bilirubin Urine: NEGATIVE
Glucose, UA: NEGATIVE mg/dL
Hgb urine dipstick: NEGATIVE
Ketones, ur: NEGATIVE mg/dL
Leukocytes,Ua: NEGATIVE
Nitrite: NEGATIVE
Protein, ur: NEGATIVE mg/dL
Specific Gravity, Urine: 1.02 (ref 1.005–1.030)
pH: 5 (ref 5.0–8.0)

## 2020-12-17 LAB — CBC
HCT: 42.3 % (ref 36.0–46.0)
Hemoglobin: 13.1 g/dL (ref 12.0–15.0)
MCH: 27.2 pg (ref 26.0–34.0)
MCHC: 31 g/dL (ref 30.0–36.0)
MCV: 87.9 fL (ref 80.0–100.0)
Platelets: 273 10*3/uL (ref 150–400)
RBC: 4.81 MIL/uL (ref 3.87–5.11)
RDW: 12.7 % (ref 11.5–15.5)
WBC: 7.7 10*3/uL (ref 4.0–10.5)
nRBC: 0 % (ref 0.0–0.2)

## 2020-12-17 LAB — LIPASE, BLOOD: Lipase: 36 U/L (ref 11–51)

## 2020-12-17 MED ORDER — MECLIZINE HCL 25 MG PO TABS
12.5000 mg | ORAL_TABLET | Freq: Once | ORAL | Status: AC
  Administered 2020-12-17: 12.5 mg via ORAL
  Filled 2020-12-17: qty 1

## 2020-12-17 MED ORDER — MECLIZINE HCL 12.5 MG PO TABS: 12.5000 mg | ORAL_TABLET | Freq: Three times a day (TID) | ORAL | 0 refills | Status: DC | PRN

## 2020-12-17 NOTE — ED Provider Notes (Signed)
[facility] EMERGENCY DEPARTMENT Provider Note  CSN: [id number] Arrival date & time: 12/17/20 0024  Chief Complaint(s) Emesis and Dizziness  HPI EVERETT RICCIARDELLI is a 70 y.o. female who presents to the emergency department for lightheadedness with associated nausea and nonbloody nonbilious emesis. Patient has had several episodes in the past several months.  Has had 3 or 4 episodes in the last 3 to 4 days. They occur sporadically. Lasts approximately 1 hour. Resolved after rest. She denies recent fevers or infections. No coughing or congestion. No focal deficits, paresthesias, visual disturbance. During these episodes she has difficulty ambulating because movement exacerbates her symptoms. Otherwise she has no difficulty ambulating.   Emesis Dizziness Associated symptoms: vomiting    Past Medical History Past Medical History:  Diagnosis Date   Hyperlipidemia    LDL elevated   Patient Active Problem List   Diagnosis Date Noted   Contusion of knee 02/25/2018   Osteoarthritis of knee 02/25/2018   Home Medication(s) Prior to Admission medications   Medication Sig Start Date End Date Taking? Authorizing Provider  acetaminophen (TYLENOL) 500 MG tablet Take 500 mg by mouth every 6 (six) hours as needed for moderate pain or headache.   Yes [provider]  meclizine (ANTIVERT) 12.5 MG tablet Take 1 tablet (12.5 mg total) by mouth 3 (three) times daily as needed for dizziness. 12/17/20  Yes Chenika Nevils, Amadeo Garnet, MD  Tetrahydrozoline-Zn Sulfate (VISINE-AC OP) Place 1 drop into both eyes daily as needed (dry eyes).   Yes [provider]  esomeprazole (NEXIUM) 20 MG capsule Take 1 capsule (20 mg total) by mouth daily at 12 noon. Patient not taking: Reported on 12/17/2020 05/10/17   Collene Gobble, MD  fluticasone [facility]led[facility]ana) 50 MCG/ACT nasal spray USE 2 SPRAYS IN [facility] NOSTRIL EVERY DAY Patient not taking: Reported on 12/17/2020 09/03/16   Faythe Ghee,  PA-C                                                                                                                                    Past Surgical History Past Surgical History:  Procedure Laterality Date   carpel tunnell release     KIDNEY DONATION     Family History No family history on file.  Social History Social History   Tobacco Use   Smoking status: Never   Smokeless tobacco: Never  Vaping Use   Vaping Use: Never used  Substance Use Topics   Alcohol use: No   Allergies Amoxicillin and Latex  Review of Systems Review of Systems  Gastrointestinal:  Positive for vomiting.  Neurological:  Positive for dizziness.  All other systems are reviewed and are negative for acute change except as noted in the HPI  Physical Exam Vital Signs  I have reviewed the triage vital signs BP (!) 133/57   Pulse 76   Temp 97.8 F (36.6 C)   Resp 15  Ht 5\' 5"  (1.651 m)   Wt 83.9 kg   SpO2 99%   BMI 30.79 kg/m   Physical Exam Vitals reviewed.  Constitutional:      General: She is not in acute distress.    Appearance: She is well-developed. She is not diaphoretic.  HENT:     Head: Normocephalic and atraumatic.     Nose: Nose normal.  Eyes:     General: No scleral icterus.       Right eye: No discharge.        Left eye: No discharge.     Conjunctiva/sclera: Conjunctivae normal.     Pupils: Pupils are equal, round, and reactive to light.  Cardiovascular:     Rate and Rhythm: Normal rate and regular rhythm.     Heart sounds: No murmur heard.   No friction rub. No gallop.  Pulmonary:     Effort: Pulmonary effort is normal. No respiratory distress.     Breath sounds: Normal breath sounds. No stridor. No rales.  Abdominal:     General: There is no distension.     Palpations: Abdomen is soft.     Tenderness: There is no abdominal tenderness.  Musculoskeletal:        General: No tenderness.     Cervical back: Normal range of motion and neck supple.  Skin:    General:  Skin is warm and dry.     Findings: No erythema or rash.  Neurological:     Mental Status: She is alert and oriented to person, place, and time.     Comments: Mental Status:  Alert and oriented to person, place, and time.  Attention and concentration normal.  Speech clear.  Recent memory is intact  Cranial Nerves:  II Visual Fields: Intact to confrontation. Visual fields intact. III, IV, VI: Pupils equal and reactive to light and near. Full eye movement without nystagmus  V Facial Sensation: Normal. No weakness of masticatory muscles  VII: No facial weakness or asymmetry  VIII Auditory Acuity: Grossly normal  IX/X: The uvula is midline; the palate elevates symmetrically  XI: Normal sternocleidomastoid and trapezius strength  XII: The tongue is midline. No atrophy or fasciculations.   Motor System: Muscle Strength: 5/5 and symmetric in the upper and lower extremities. No pronation or drift.  Muscle Tone: Tone and muscle bulk are normal in the upper and lower extremities.  Reflexes: DTRs: 1+ and symmetrical in all four extremities. No Clonus Coordination: Intact finger-to-nose. No tremor.  Sensation: Intact to light touch. Gait: Routine gait normal.     ED Results and Treatments Labs (all labs ordered are listed, but only abnormal results are displayed) Labs Reviewed  COMPREHENSIVE METABOLIC PANEL - Abnormal; Notable for the following components:      Result Value   Glucose, Bld 117 (*)    All other components within normal limits  LIPASE, BLOOD  CBC  URINALYSIS, ROUTINE W REFLEX MICROSCOPIC  EKG  EKG Interpretation  Date/Time:    Ventricular Rate:    PR Interval:    QRS Duration:   QT Interval:    QTC Calculation:   R Axis:     Text Interpretation:         Radiology CT HEAD WO CONTRAST ( )  Result Date: 12/17/2020 CLINICAL DATA:  Vomiting  and dizziness since yesterday. EXAM: CT HEAD WITHOUT CONTRAST TECHNIQUE: Contiguous axial images were obtained from the base of the skull through the vertex without intravenous contrast. COMPARISON:  CT head dated November 19, 2011. FINDINGS: Brain: No evidence of acute infarction, hemorrhage, hydrocephalus, extra-axial collection or mass lesion/mass effect. Vascular: No hyperdense vessel or unexpected calcification. Skull: Normal. Negative for fracture or focal lesion. Sinuses/Orbits: Small amount of secretions in the right frontal sinus. Remaining paranasal sinuses and mastoid air cells are clear. The orbits are unremarkable. Other: None. IMPRESSION: 1. No acute intracranial abnormality. Electronically Signed   By: Obie Dredge M.D.   On: 12/17/2020 05:10    Pertinent labs & imaging results that were available during my care of the patient were reviewed by me and considered in my medical decision making (see MDM for details).  Medications Ordered in ED Medications  meclizine (ANTIVERT) tablet 12.5 mg (12.5 mg Oral Given 12/17/20 0417)                                                                                                                                     Procedures Procedures  (including critical care time)  Medical Decision Making / ED Course I have reviewed the nursing notes for this encounter and the patient's prior records (if available in EHR or on provided paperwork).  HARLOW BASLEY was evaluated in Emergency Department on 12/17/2020 for the symptoms described in the history of present illness. She was evaluated in the context of the global COVID-19 pandemic, which necessitated consideration that the patient might be at risk for infection with the SARS-CoV-2 virus that causes COVID-19. Institutional protocols and algorithms that pertain to the evaluation of patients at risk for COVID-19 are in a state of rapid change based on information released by regulatory bodies including the CDC  and federal and state organizations. These policies and algorithms were followed during the patient's care in the ED.     Intermittent episodes of vertiginous symptoms. Exam is nonfocal. Gait stable.  Pertinent labs & imaging results that were available during my care of the patient were reviewed by me and considered in my medical decision making:  Screening labs obtain and grossly reassuring without significant electrolyte derangements.  No leukocytosis or anemia.  UA without evidence of infection. CT head without evidence of old stroke or mass-effect. Doubt CVA. Treated symptomatically with meclizine which resolve her symptoms.  Final Clinical Impression(s) / ED Diagnoses Final diagnoses:  Lightheadedness   The patient appears reasonably screened and/or stabilized for discharge and I doubt any  other medical condition or other [facility] requiring further screening, evaluation, or treatment in the ED at this time prior to discharge. Safe for discharge with strict return precautions.  Disposition: Discharge  Condition: Good  I have discussed the results, Dx and Tx plan with the patient/family who expressed understanding and agree(s) with the plan. Discharge instructions discussed at length. The patient/family was given strict return precautions who verbalized understanding of the instructions. No further questions at time of discharge.    ED Discharge Orders          Ordered    meclizine (ANTIVERT) 12.5 MG tablet  3 times daily PRN        12/17/20 0608              Follow Up: Primary care provider  Call  to schedule an appointment for close follow up     This chart was dictated using voice recognition software.  Despite best efforts to proofread,  errors can occur which can change the documentation meaning.    Nira Connardama, Octavion Mollenkopf Eduardo, MD 12/17/20 [phone]

## 2020-12-17 NOTE — ED Notes (Signed)
Patient transported to CT 

## 2020-12-17 NOTE — ED Triage Notes (Signed)
Pt c/o vomiting and dizziness since yesterday.

## 2021-01-07 ENCOUNTER — Emergency Department (HOSPITAL_COMMUNITY)
Admission: EM | Admit: 2021-01-07 | Discharge: 2021-01-08 | Disposition: A | Discharge status: Home / Self Care | Payer: Medicare Other | Attending: Emergency Medicine | Admitting: Emergency Medicine

## 2021-01-07 ENCOUNTER — Emergency Department (HOSPITAL_COMMUNITY): Payer: Medicare Other

## 2021-01-07 ENCOUNTER — Other Ambulatory Visit: Payer: Self-pay

## 2021-01-07 DIAGNOSIS — Z91040 Latex allergy status: Secondary | ICD-10-CM | POA: Diagnosis not present

## 2021-01-07 DIAGNOSIS — R079 Chest pain, unspecified: Secondary | ICD-10-CM | POA: Diagnosis not present

## 2021-01-07 DIAGNOSIS — R070 Pain in throat: Secondary | ICD-10-CM | POA: Insufficient documentation

## 2021-01-07 DIAGNOSIS — R42 Dizziness and giddiness: Secondary | ICD-10-CM | POA: Insufficient documentation

## 2021-01-07 DIAGNOSIS — R112 Nausea with vomiting, unspecified: Secondary | ICD-10-CM | POA: Insufficient documentation

## 2021-01-07 DIAGNOSIS — R1084 Generalized abdominal pain: Secondary | ICD-10-CM | POA: Insufficient documentation

## 2021-01-07 LAB — CBC
HCT: 43.2 % (ref 36.0–46.0)
Hemoglobin: 14.3 g/dL (ref 12.0–15.0)
MCH: 27.7 pg (ref 26.0–34.0)
MCHC: 33.1 g/dL (ref 30.0–36.0)
MCV: 83.6 fL (ref 80.0–100.0)
Platelets: 252 10*3/uL (ref 150–400)
RBC: 5.17 MIL/uL — ABNORMAL HIGH (ref 3.87–5.11)
RDW: 12.6 % (ref 11.5–15.5)
WBC: 13.5 10*3/uL — ABNORMAL HIGH (ref 4.0–10.5)
nRBC: 0 % (ref 0.0–0.2)

## 2021-01-07 LAB — COMPREHENSIVE METABOLIC PANEL
ALT: 16 U/L (ref 0–44)
AST: 22 U/L (ref 15–41)
Albumin: 3.9 g/dL (ref 3.5–5.0)
Alkaline Phosphatase: 65 U/L (ref 38–126)
Anion gap: 14 (ref 5–15)
BUN: 12 mg/dL (ref 8–23)
CO2: 21 mmol/L — ABNORMAL LOW (ref 22–32)
Calcium: 9.6 mg/dL (ref 8.9–10.3)
Chloride: 105 mmol/L (ref 98–111)
Creatinine, Ser: 0.99 mg/dL (ref 0.44–1.00)
GFR, Estimated: >60 mL/min (ref >60)
Glucose, Bld: 150 mg/dL — ABNORMAL HIGH (ref 70–99)
Potassium: 3.3 mmol/L — ABNORMAL LOW (ref 3.5–5.1)
Sodium: 140 mmol/L (ref 135–145)
Total Bilirubin: 0.9 mg/dL (ref 0.3–1.2)
Total Protein: 7.7 g/dL (ref 6.5–8.1)

## 2021-01-07 LAB — URINALYSIS, ROUTINE W REFLEX MICROSCOPIC
Bilirubin Urine: NEGATIVE
Glucose, UA: NEGATIVE mg/dL
Hgb urine dipstick: NEGATIVE
Ketones, ur: NEGATIVE mg/dL
Leukocytes,Ua: NEGATIVE
Nitrite: NEGATIVE
Protein, ur: NEGATIVE mg/dL
Specific Gravity, Urine: 1.018 (ref 1.005–1.030)
pH: 9 — ABNORMAL HIGH (ref 5.0–8.0)

## 2021-01-07 LAB — LIPASE, BLOOD: Lipase: 30 U/L (ref 11–51)

## 2021-01-07 MED ORDER — ALUM & MAG HYDROXIDE-SIMETH 200-200-20 MG/5ML PO SUSP
30.0000 mL | Freq: Once | ORAL | Status: AC
  Administered 2021-01-07: 30 mL via ORAL
  Filled 2021-01-07: qty 30

## 2021-01-07 MED ORDER — ONDANSETRON 4 MG PO TBDP
8.0000 mg | ORAL_TABLET | Freq: Once | ORAL | Status: DC
  Filled 2021-01-07: qty 2

## 2021-01-07 MED ORDER — MECLIZINE HCL 25 MG PO TABS
25.0000 mg | ORAL_TABLET | Freq: Once | ORAL | Status: AC
  Administered 2021-01-07: 25 mg via ORAL
  Filled 2021-01-07: qty 1

## 2021-01-07 MED ORDER — LIDOCAINE VISCOUS HCL 2 % MT SOLN
15.0000 mL | Freq: Once | OROMUCOSAL | Status: AC
  Administered 2021-01-07: 15 mL via ORAL
  Filled 2021-01-07: qty 15

## 2021-01-07 MED ORDER — ONDANSETRON HCL 4 MG/2ML IJ SOLN
4.0000 mg | Freq: Once | INTRAMUSCULAR | Status: AC
  Administered 2021-01-07: 4 mg via INTRAVENOUS
  Filled 2021-01-07: qty 2

## 2021-01-07 MED ORDER — LACTATED RINGERS IV BOLUS
1000.0000 mL | Freq: Once | INTRAVENOUS | Status: AC
  Administered 2021-01-07: 1000 mL via INTRAVENOUS

## 2021-01-07 MED ORDER — IOHEXOL 350 MG/ML SOLN
80.0000 mL | Freq: Once | INTRAVENOUS | Status: AC | PRN
  Administered 2021-01-07: 80 mL via INTRAVENOUS

## 2021-01-07 MED ORDER — ONDANSETRON 4 MG PO TBDP
4.0000 mg | ORAL_TABLET | Freq: Once | ORAL | Status: AC | PRN
  Administered 2021-01-07: 4 mg via ORAL
  Filled 2021-01-07: qty 1

## 2021-01-07 MED ORDER — FENTANYL CITRATE PF 50 MCG/ML IJ SOSY
50.0000 ug | PREFILLED_SYRINGE | Freq: Once | INTRAMUSCULAR | Status: AC
  Administered 2021-01-07: 50 ug via INTRAVENOUS
  Filled 2021-01-07: qty 1

## 2021-01-07 NOTE — ED Provider Notes (Signed)
Emergency Medicine Provider Triage Evaluation Note  Barbara Powers , a 71 y.o. female  was evaluated in triage.  Pt complains of abdominal pain.  She states that abdominal pain started this morning.  She says that it is localized to her epigastric region.  She rates it 10 out of 10 in intensity.  Is been constant.  She says it started gradual and was worsened throughout the day.  Nothing is made it better or worse.  She has had associated nausea and vomiting with no blood noted in her vomit.  She does have chest pain due to the constant vomiting.  She has associated chills.  She says that she has had a BM since she has been here which was normal consistency.  She is using bowel movements regularly.  No blood noted in her stool.  Also endorses some lightheadedness and dizziness.  This is actually been going on for about 2 weeks where she was recently seen by the ED and they diagnosed her with vertigo and placed her on meclizine.  Patient states that this did not help her so she stopped taking it.  Denies any shortness of breath, leg swelling, vision changes, headache Per patient's family, patient has no notable past medical history and does not take any medications.  Denies any alcohol use.  Review of Systems  Positive: Abdominal pain, nausea, vomiting, chills Negative: Fever, diarrhea, constipation, shortness of breath  Physical Exam  BP (!) 164/88   Pulse 81   Temp (!) 97.5 F (36.4 C)   Resp (!) 22   SpO2 97%  Gen:   Awake, appears to be in distress due to pain. Resp:  Normal effort MSK:   Moves extremities without difficulty Other:  Abdomen is soft.  Generalized tenderness to palpation.  No masses noted.   Medical Decision Making  Medically screening exam initiated at 3:00 PM.  Appropriate orders placed.  Barbara Powers was informed that the remainder of the evaluation will be completed by another provider, this initial triage assessment does not replace that evaluation, and the  importance of remaining in the ED until their evaluation is complete.   Therese Sarah 01/07/21 1504    Mancel Bale, MD 01/07/21 [phone]

## 2021-01-07 NOTE — ED Triage Notes (Signed)
Pt from home for eval of dizziness, n/v, and generalized abdominal pain since this morning. No diarrhea.

## 2021-01-07 NOTE — ED Provider Notes (Signed)
[place] EMERGENCY DEPARTMENT Provider Note  CSN: [id number] Arrival date & time: 01/07/21 1322    History Chief Complaint  Patient presents with  . Dizziness  . Abdominal Pain  . Emesis    Barbara Powers is a 70 y.o. female with history of intermittent dizziness off and on for the last few months most recently seen in the ED earlier this month had neg workup, diagnosed with vertigo and given meclizine which she tells me was helping (told Triage it was not) and that her PCP told her to stop taking it ( per note from that visit she was told to take it only as needed instead of everyday and was taught Eppley maneuvers). She reports today she is having dizziness and nausea again but also having severe epigastric abdominal pain radiating into her chest. She is moaning and rocking in bed and not able to elaborate much due to her discomfort.    Past Medical History:  Diagnosis Date  . Hyperlipidemia    LDL elevated    Past Surgical History:  Procedure Laterality Date  . carpel tunnell release    . KIDNEY DONATION      No family history on file.  Social History   Tobacco Use  . Smoking status: Never  . Smokeless tobacco: Never  Vaping Use  . Vaping Use: Never used  Substance Use Topics  . Alcohol use: No     Home Medications Prior to Admission medications   Medication Sig Start Date End Date Taking? Authorizing Provider  acetaminophen (TYLENOL) 500 MG tablet Take 500 mg by mouth every 6 (six) hours as needed for moderate pain or headache.    [provider]  esomeprazole (NEXIUM) 20 MG capsule Take 1 capsule (20 mg total) by mouth daily at 12 noon. Patient not taking: Reported on 12/17/2020 05/10/17   Collene Gobble, MD  fluticasone [facility]) 50 MCG/ACT nasal spray USE 2 SPRAYS IN [facility] NOSTRIL EVERY DAY Patient not taking: Reported on 12/17/2020 09/03/16   Faythe Ghee, PA-C  meclizine (ANTIVERT) 12.5 MG tablet Take 1 tablet (12.5 mg total) by mouth 3 (three)  times daily as needed for dizziness. 12/17/20   Cardama, Amadeo Garnet, MD  Tetrahydrozoline-Zn Sulfate (VISINE-AC OP) Place 1 drop into both eyes daily as needed (dry eyes).    [provider]     Allergies    Amoxicillin and Latex   Review of Systems   Review of Systems Unable to assess due to patient discomfort.    Physical Exam BP 140/84 (BP Location: Right Arm)   Pulse 90   Temp (!) 97.5 F (36.4 C)   Resp 13   SpO2 95%   Physical Exam Vitals and nursing note reviewed.  Constitutional:      General: She is not in acute distress.    Appearance: Normal appearance.  HENT:     Head: Normocephalic and atraumatic.     Nose: Nose normal.     Mouth/Throat:     Mouth: Mucous membranes are moist.  Eyes:     Extraocular Movements: Extraocular movements intact.     Conjunctiva/sclera: Conjunctivae normal.  Cardiovascular:     Rate and Rhythm: Normal rate.  Pulmonary:     Effort: Pulmonary effort is normal.  Abdominal:     General: Abdomen is flat.     Palpations: Abdomen is soft.     Tenderness: There is generalized abdominal tenderness. There is no guarding. Negative signs include Murphy's sign and  McBurney's sign.     Comments: Exam limited by patient curled into fetal position  Musculoskeletal:        General: No swelling. Normal range of motion.     Cervical back: Neck supple.  Skin:    General: Skin is warm and dry.  Neurological:     General: No focal deficit present.     Mental Status: She is alert.  Psychiatric:        Mood and Affect: Mood normal.     ED Results / Procedures / Treatments   Labs (all labs ordered are listed, but only abnormal results are displayed) Labs Reviewed  COMPREHENSIVE METABOLIC PANEL - Abnormal; Notable for the following components:      Result Value   Potassium 3.3 (*)    CO2 21 (*)    Glucose, Bld 150 (*)    All other components within normal limits  CBC - Abnormal; Notable for the following components:   WBC 13.5  (*)    RBC 5.17 (*)    All other components within normal limits  LIPASE, BLOOD  URINALYSIS, ROUTINE W REFLEX MICROSCOPIC    EKG None  Radiology No results found.  Procedures Procedures  Medications Ordered in the ED Medications  ondansetron (ZOFRAN-ODT) disintegrating tablet 8 mg (has no administration in time range)  lactated ringers bolus 1,000 mL (has no administration in time range)  fentaNYL (SUBLIMAZE) injection 50 mcg (has no administration in time range)  meclizine (ANTIVERT) tablet 25 mg (has no administration in time range)  ondansetron (ZOFRAN-ODT) disintegrating tablet 4 mg (4 mg Oral Given 01/07/21 1437)     MDM Rules/Calculators/A&P MDM Patient's history and exam are limited by her discomfort. Will give pain medication, antiemetics and IVF and reassess. Labs ordered in triage are unremarkable including CBC, CMP and lipase. Will send for CT given her difficult exam.   ED Course  I have reviewed the triage vital signs and the nursing notes.  Pertinent labs & imaging results that were available during my care of the patient were reviewed by me and considered in my medical decision making (see chart for details).  Clinical Course as of 01/08/21 1029  Tue Jan 07, 2021  2238 CT is negative. [CS]  2245 Patient is feeling better, now complaining of 'throat burning'. Will give a GI cocktail, awaiting UA.  [CS]  2315 UA is normal.  [CS]  2321 Patient had another episode of vomiting but was able to get down some of the GI cocktail. Will encourage her to drink. Care of the patient will be signed out to Dr. Adela Lank at the change of shift pending reassessment and ambulatory trial.  [CS]    Clinical Course User Index [CS] Pollyann Savoy, MD    Final Clinical Impression(s) / ED Diagnoses Final diagnoses:  None    Rx / DC Orders ED Discharge Orders     None        Pollyann Savoy, MD 01/08/21 1029

## 2021-01-08 ENCOUNTER — Emergency Department (HOSPITAL_COMMUNITY): Payer: Medicare Other

## 2021-01-08 LAB — TROPONIN I (HIGH SENSITIVITY): Troponin I (High Sensitivity): 8 ng/L (ref <18)

## 2021-01-08 MED ORDER — PROMETHAZINE HCL 25 MG RE SUPP: 25.0000 mg | Freq: Four times a day (QID) | RECTAL | 0 refills | Status: AC | PRN

## 2021-01-08 MED ORDER — ONDANSETRON 4 MG PO TBDP: ORAL_TABLET | ORAL | 0 refills | Status: AC

## 2021-01-08 MED ORDER — PROCHLORPERAZINE EDISYLATE 10 MG/2ML IJ SOLN
10.0000 mg | Freq: Once | INTRAMUSCULAR | Status: AC
  Administered 2021-01-08: 10 mg via INTRAVENOUS
  Filled 2021-01-08: qty 2

## 2021-01-08 MED ORDER — MECLIZINE HCL 25 MG PO TABS: 25.0000 mg | ORAL_TABLET | Freq: Three times a day (TID) | ORAL | 0 refills | Status: AC | PRN

## 2021-01-08 MED ORDER — DIPHENHYDRAMINE HCL 50 MG/ML IJ SOLN
25.0000 mg | Freq: Once | INTRAMUSCULAR | Status: AC
  Administered 2021-01-08: 25 mg via INTRAVENOUS
  Filled 2021-01-08: qty 1

## 2021-01-08 MED ORDER — DEXAMETHASONE SODIUM PHOSPHATE 10 MG/ML IJ SOLN
10.0000 mg | Freq: Once | INTRAMUSCULAR | Status: AC
  Administered 2021-01-08: 10 mg via INTRAVENOUS
  Filled 2021-01-08: qty 1

## 2021-01-08 MED ORDER — DEXAMETHASONE 4 MG PO TABS
10.0000 mg | ORAL_TABLET | Freq: Once | ORAL | Status: DC
Start: 2021-01-08 — End: 2021-01-08

## 2021-01-08 MED ORDER — SODIUM CHLORIDE 0.9 % IV BOLUS
1000.0000 mL | Freq: Once | INTRAVENOUS | Status: AC
  Administered 2021-01-08: 1000 mL via INTRAVENOUS

## 2021-01-08 NOTE — ED Notes (Addendum)
Pt ambulated in hall will fair gait. Pt stated dizziness is better. Pt very groggy after MRI. Pt states abdominal pain 7/10 Pt in bed on monitoring

## 2021-01-08 NOTE — ED Notes (Signed)
Patient transported to MRI 

## 2021-01-08 NOTE — ED Provider Notes (Signed)
70 yo F with a chief complaints of dizziness nausea vomiting and abdominal discomfort.  She feels like the abdominal discomfort when I asked her started after the nausea vomiting and dizziness.  Has had episodes of dizziness off and on that she has taken meclizine for.  Has been seen in the ED for this previously.  I received the patient in signout from Dr. Bernette Mayers.  Plan for oral trial and attempt to ambulate and reassess.  Patient with persistent vomiting.  Also very unsteady with just a couple steps.  She has trouble describing the dizziness for me.  I do not appreciate any significant neurologic deficit on exam with the exception of the difficulty ambulating.  Plan to give a headache cocktail and MRI and reassess.  MRI is negative.  Patient is able to ambulate now independently.  And states that she feels better but is still having ongoing abdominal discomfort.  Tolerating by mouth here.  Discussed continued therapy here versus letting her go home and follow-up with her family doctor.  Electing for the latter.   Melene Plan, DO 01/08/21 [phone]

## 2021-01-08 NOTE — ED Notes (Signed)
Pt ambulated, during ambulation pt was unsteady and needed to sit down within of ambulation.

## 2021-01-08 NOTE — ED Notes (Signed)
Patient verbalizes understanding of discharge instructions. Opportunity for questioning and answers were provided. Armband removed by staff, pt discharged from ED via wheelchair with daughter.  

## 2021-01-08 NOTE — Discharge Instructions (Signed)
Please call your doctor in the morning.  Please return for worsening abdominal pain fever or inability to eat or drink.

## 2022-11-06 IMAGING — CT CT HEAD W/O CM
3 of 4 series · 15 of 47 positions shown, 18 images · non-contrast
Comparison: CT head dated November 19, 2011.

CLINICAL DATA: Vomiting and dizziness since yesterday.

EXAM:
CT HEAD WITHOUT CONTRAST
TECHNIQUE: Contiguous axial images were obtained from the base of the skull
through the vertex without intravenous contrast.

[Series 3: head 5.0 h30s · axial · 0.42mm/px · z∈[-145,-15]mm · 9 of 32 slices shown, 12 images]
[im 3/32  brain]
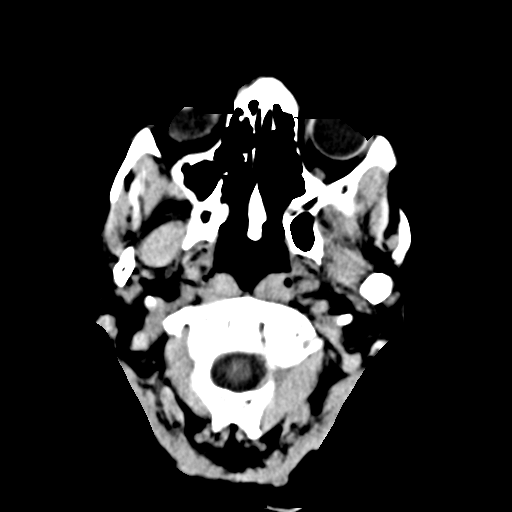
[im 3/32  bone]
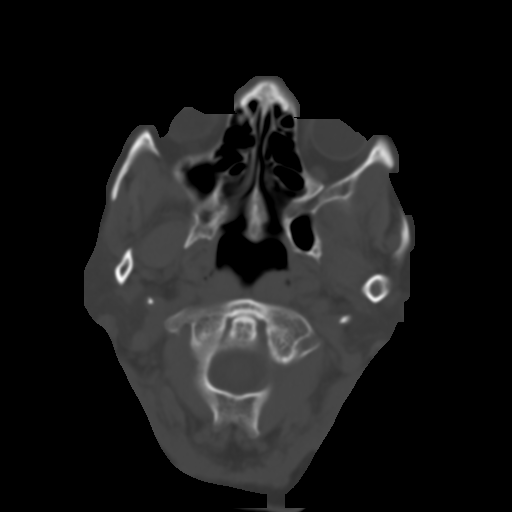
[im 7/32  brain]
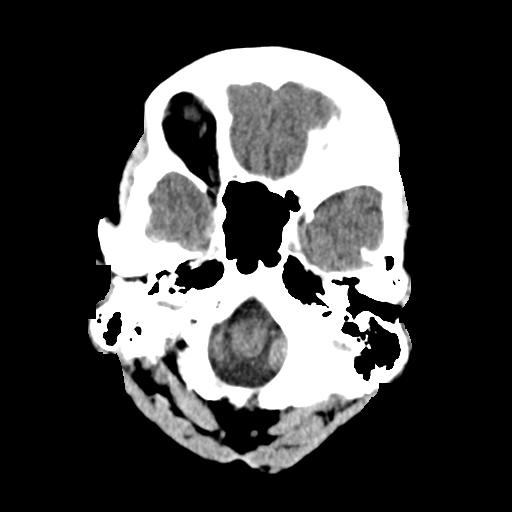
[im 9/32  brain]
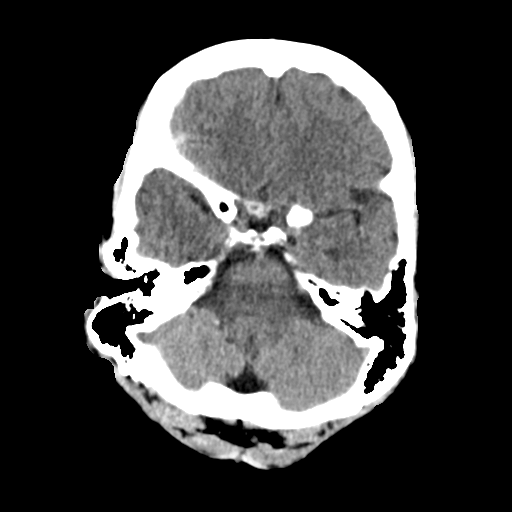
[im 14/32  brain]
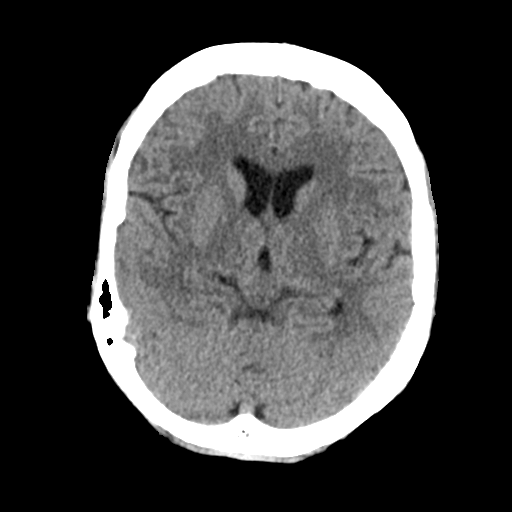
[im 16/32  brain]
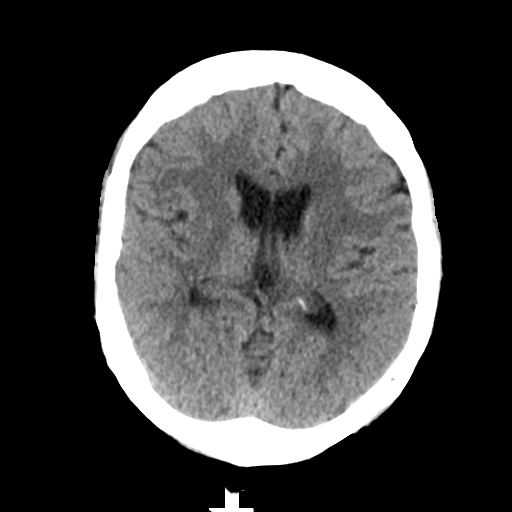
[im 16/32  bone]
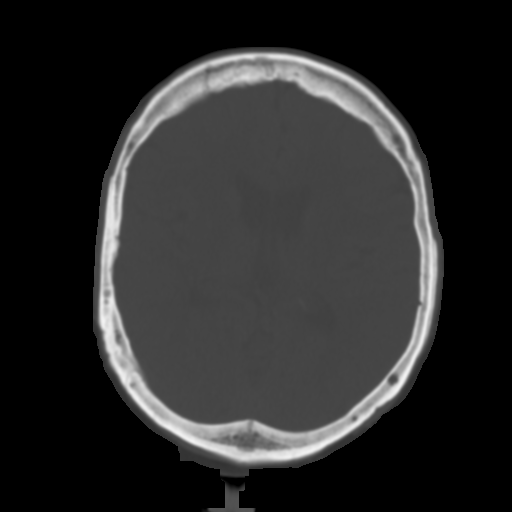
[im 18/32  brain]
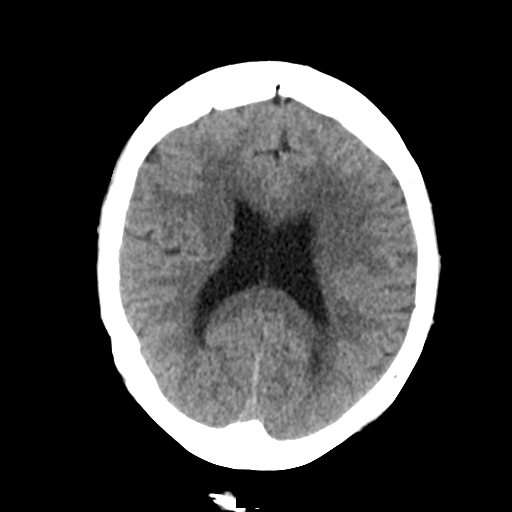
[im 23/32  brain]
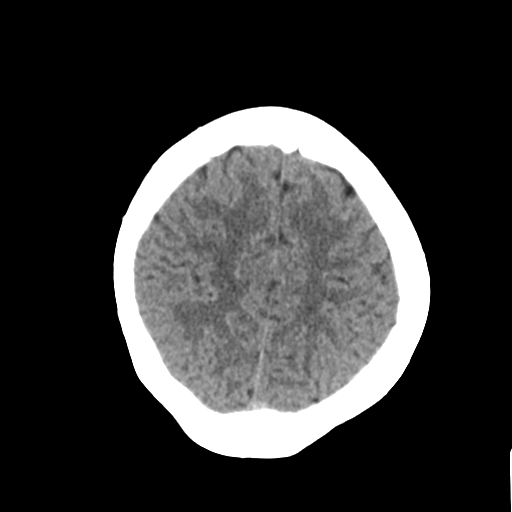
[im 25/32  brain]
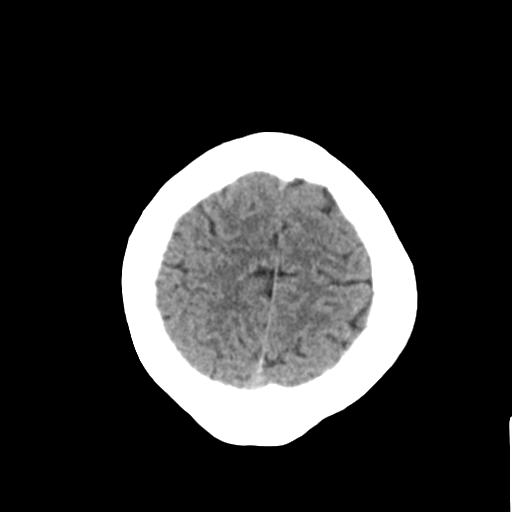
[im 29/32  brain]
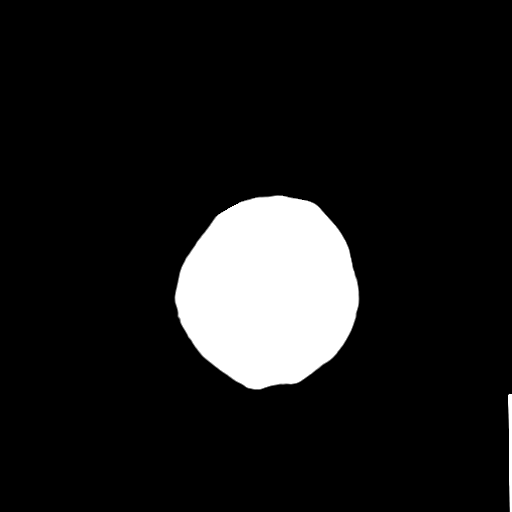
[im 29/32  bone]
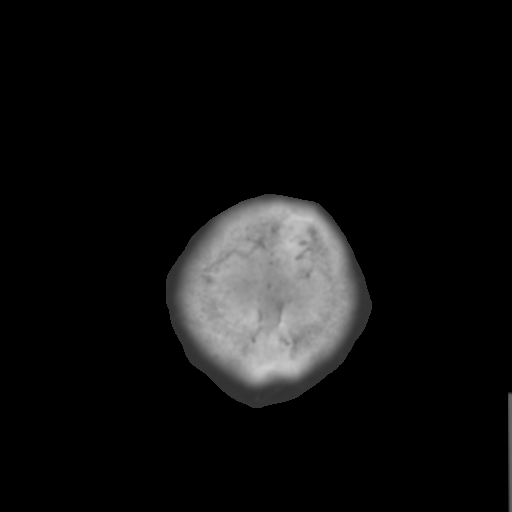

[Series 5: head 3.0 mpr cor · coronal · 0.32mm/px · 3 of 63 slices shown]
[im 21/63  brain]
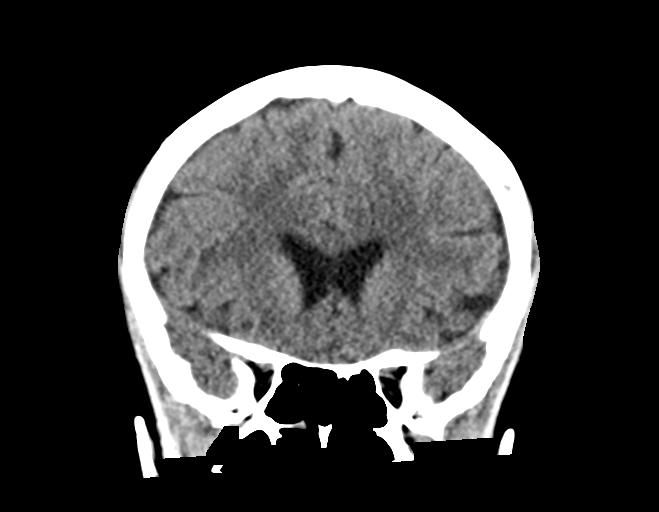
[im 28/63  brain]
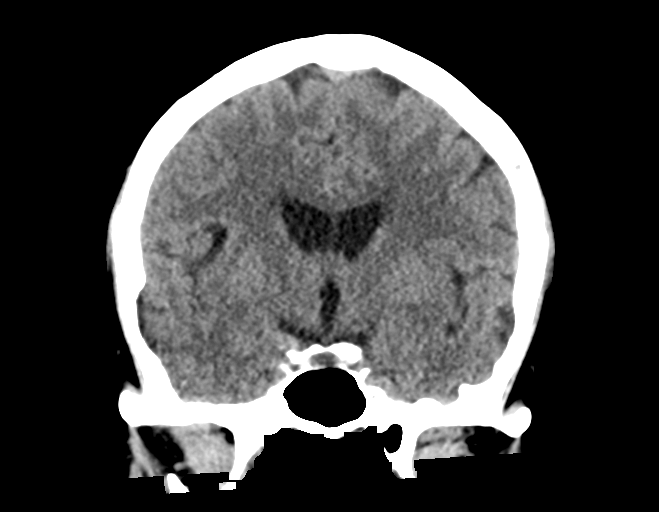
[im 35/63  brain]
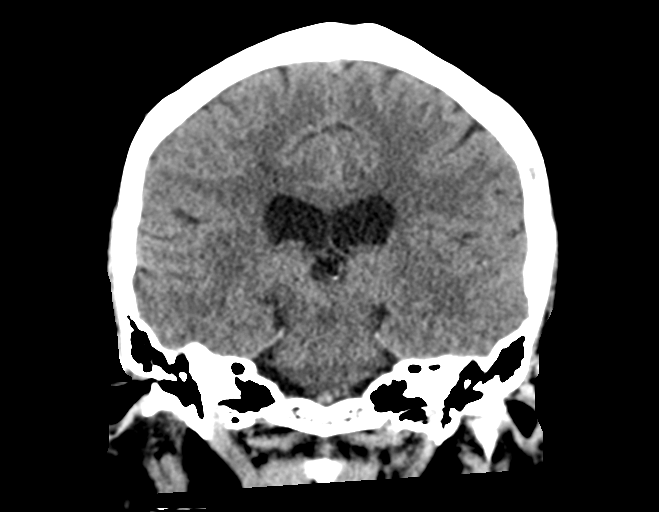

[Series 6: head 3.0 mpr sag · sagittal · 0.32mm/px · 3 of 55 slices shown]
[im 19/55  brain]
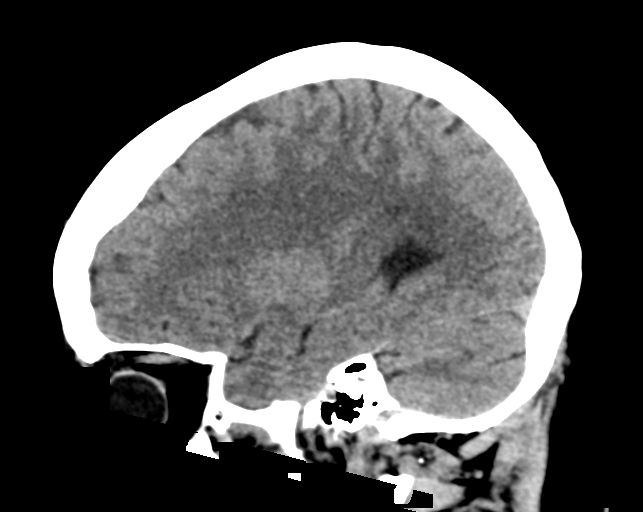
[im 28/55  brain]
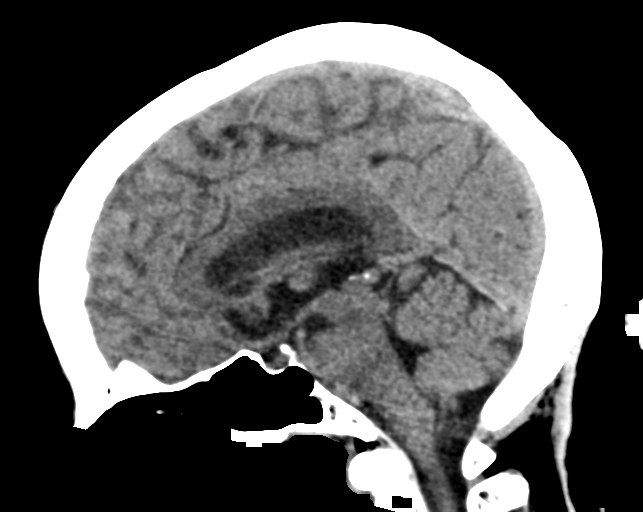
[im 37/55  brain]
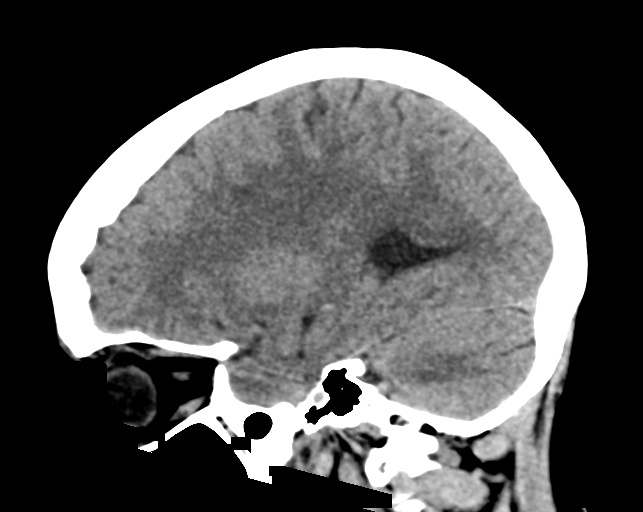

[15 of 47 positions shown; findings below may reference images not displayed]

FINDINGS: Brain: No evidence of acute infarction, hemorrhage, hydrocephalus,
extra-axial collection or mass lesion/mass effect.

Vascular: No hyperdense vessel or unexpected calcification.

Skull: Normal. Negative for fracture or focal lesion.

Sinuses/Orbits: Small amount of secretions in the right frontal
sinus. Remaining paranasal sinuses and mastoid air cells are clear.
The orbits are unremarkable.

Other: None.
IMPRESSION: 1. No acute intracranial abnormality.

## 2022-11-27 IMAGING — CT CT ABD-PELV W/ CM
2 of 5 series · 16 of 46 positions shown, 18 images · IV contrast (omnipaque)
Comparison: 11/19/2011

CLINICAL DATA: Abdominal abscess/infection suspected. Renal cell
carcinoma.

EXAM:
CT ABDOMEN AND PELVIS WITH CONTRAST
TECHNIQUE: Multidetector CT imaging of the abdomen and pelvis was performed
using the standard protocol following bolus administration of
intravenous contrast.
CONTRAST:  80mL OMNIPAQUE IOHEXOL 350 MG/ML SOLN

[Series 3: a/p w/ 5mm · axial · 0.92mm/px · z∈[-966,-556]mm · 13 of 92 slices shown, 15 images]
[im 5/92  soft-tissue]
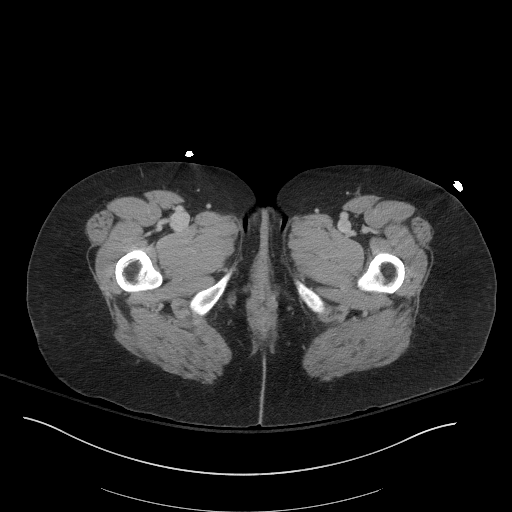
[im 5/92  bone]
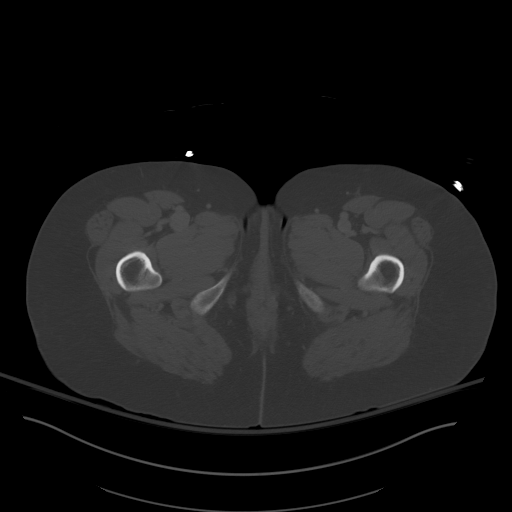
[im 14/92  soft-tissue]
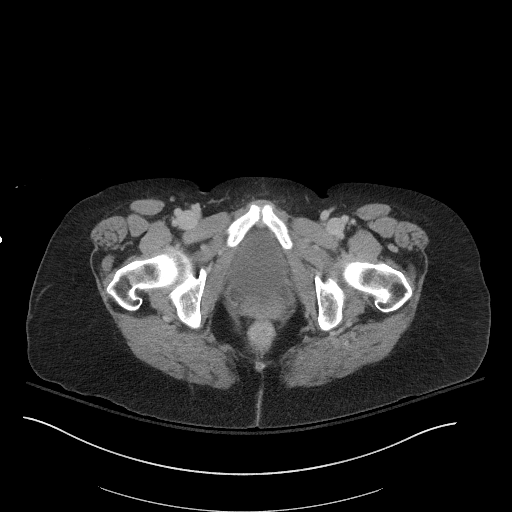
[im 18/92  soft-tissue]
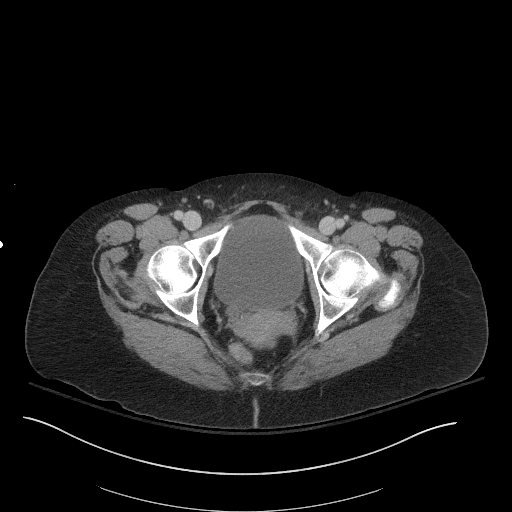
[im 27/92  soft-tissue]
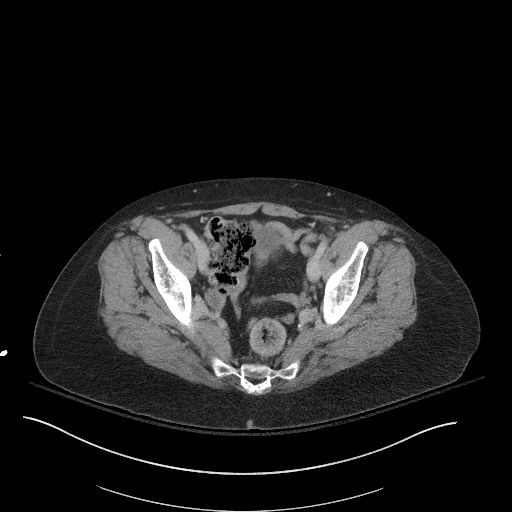
[im 31/92  soft-tissue]
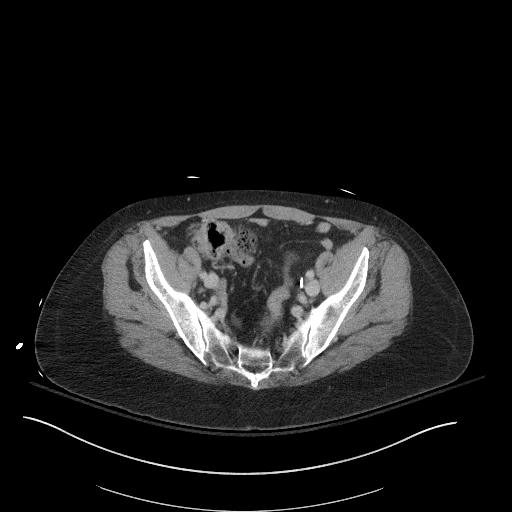
[im 40/92  soft-tissue]
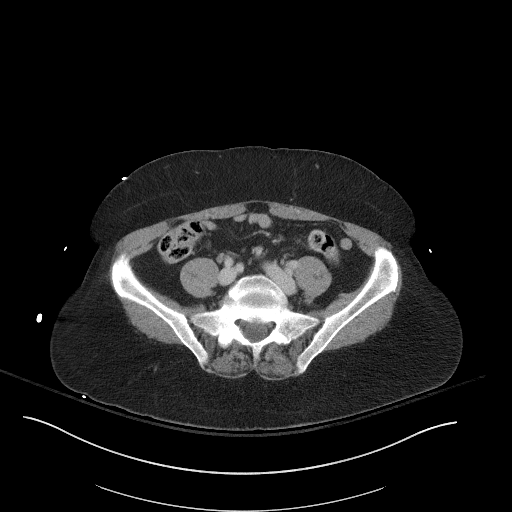
[im 48/92  soft-tissue]
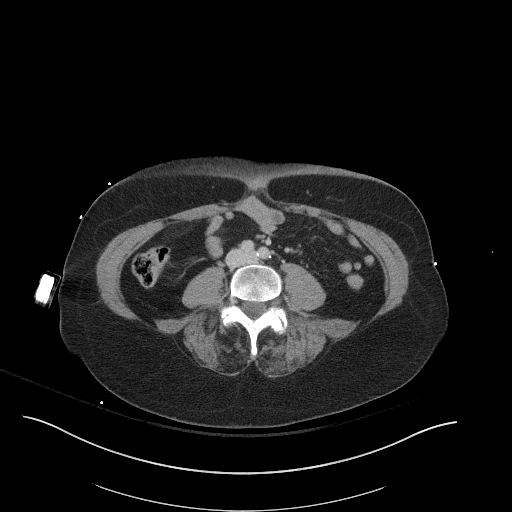
[im 53/92  soft-tissue]
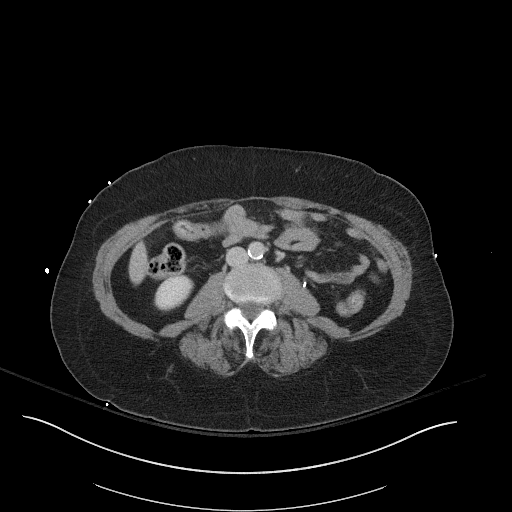
[im 61/92  soft-tissue]
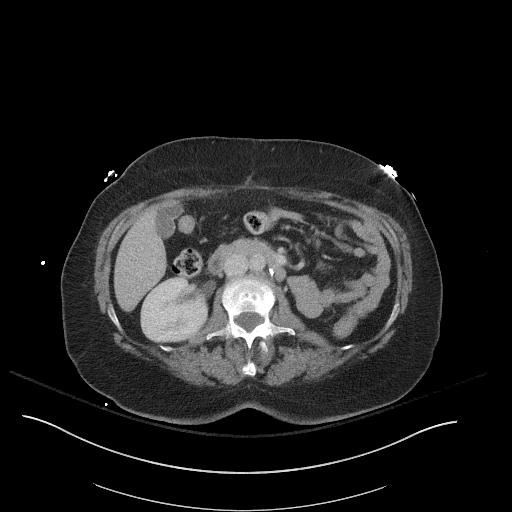
[im 61/92  bone]
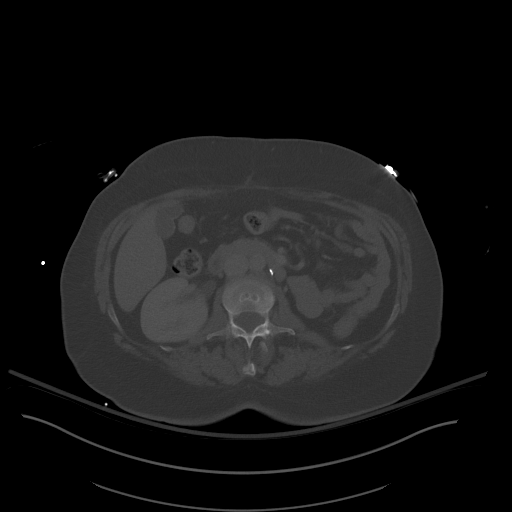
[im 66/92  soft-tissue]
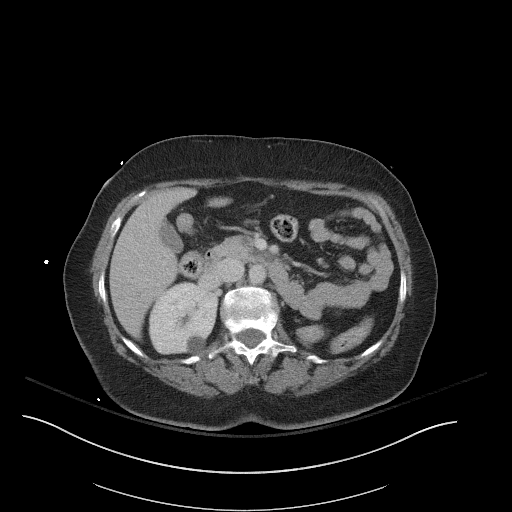
[im 74/92  soft-tissue]
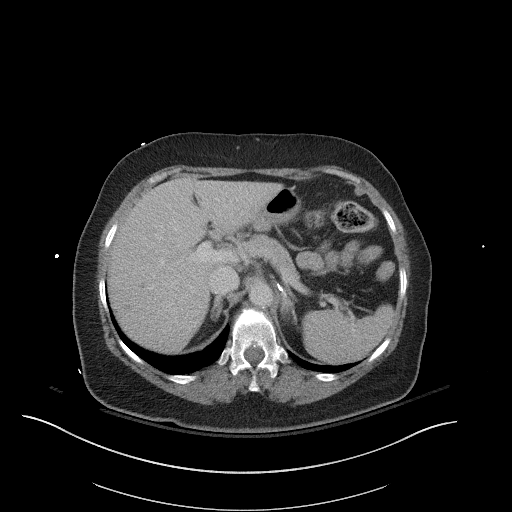
[im 79/92  soft-tissue]
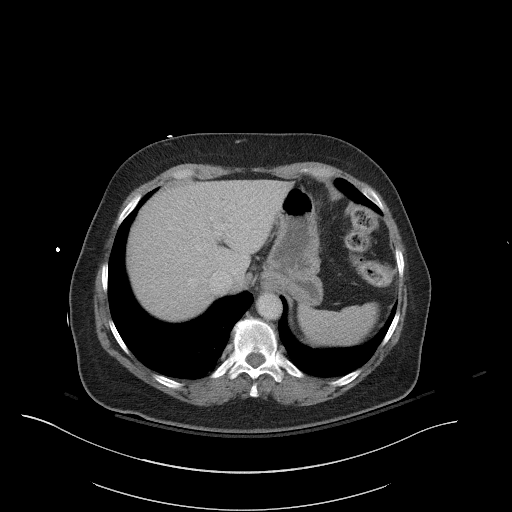
[im 87/92  soft-tissue]
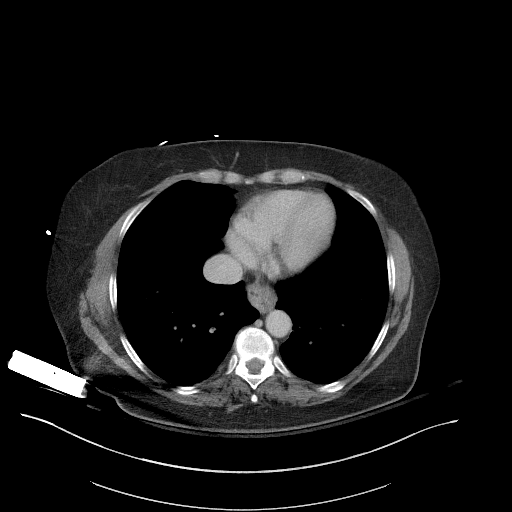

[Series 6: a/p w/ cor · coronal · 0.82mm/px · 3 of 120 slices shown]
[im 40/120  soft-tissue]
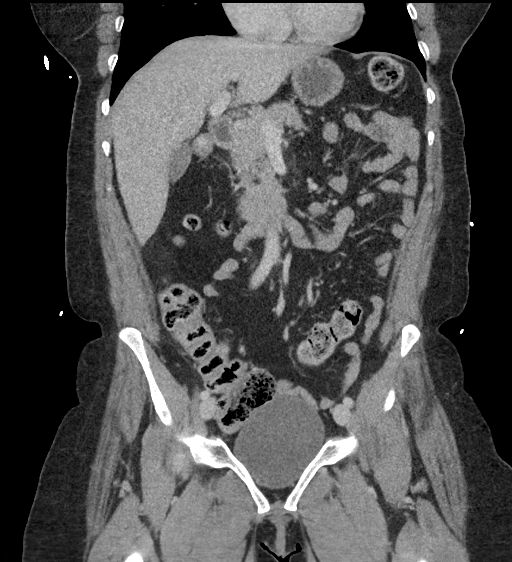
[im 53/120  soft-tissue]
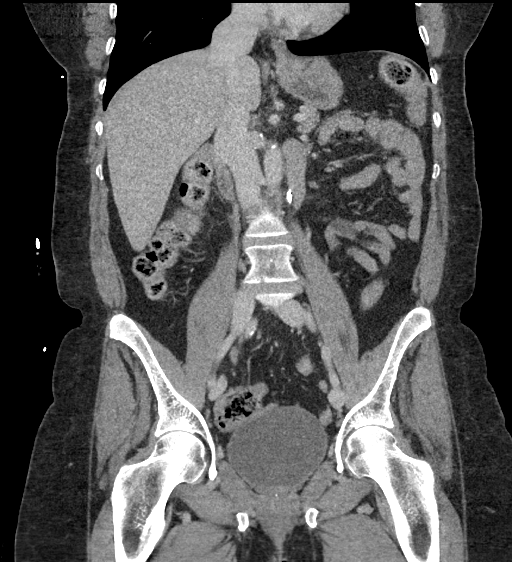
[im 67/120  soft-tissue]
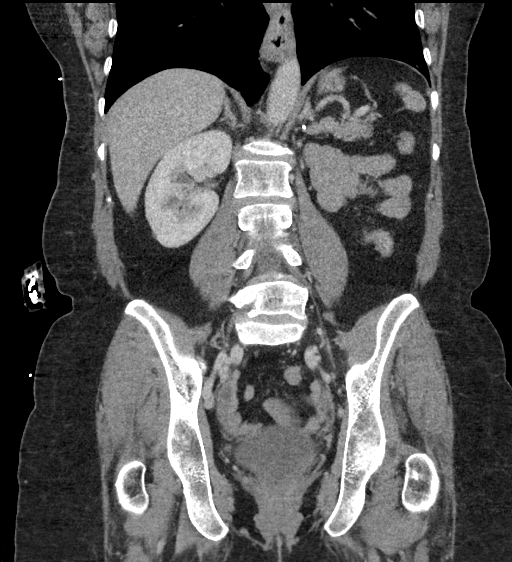

[16 of 46 positions shown; findings below may reference images not displayed]

FINDINGS: Lower chest: Visualized lung bases are clear bilaterally. Visualized
heart and pericardium are unremarkable. Small hiatal hernia is
present.

Hepatobiliary: Stable scattered tiny hepatic cysts. Liver otherwise
unremarkable. Gallbladder unremarkable. No intra or extrahepatic
biliary ductal dilation.

Pancreas: Unremarkable

Spleen: Unremarkable

Adrenals/Urinary Tract: The adrenal glands are unremarkable.
Surgical changes of left nephrectomy are identified. Simple cortical
cyst seen within the right kidney. The right kidney is normal in
size and position. No hydronephrosis. No intrarenal or ureteral
calculi. The bladder is unremarkable.

Stomach/Bowel: Stomach is within normal limits. Appendix appears
normal. No evidence of bowel wall thickening, distention, or
inflammatory changes. No free intraperitoneal gas or fluid.

Vascular/Lymphatic: Mild aortoiliac atherosclerotic calcification.
No aortic aneurysm. No pathologic adenopathy within the abdomen and
pelvis.

Reproductive: Uterus and bilateral adnexa are unremarkable.

Other: Small broad-based fat containing umbilical hernia. The rectum
is unremarkable.

Musculoskeletal: No acute bone abnormality. No lytic or blastic bone
lesion identified.
IMPRESSION: No acute intra-abdominal pathology. No definite radiographic
explanation of the patient's reported symptoms.

Status post left nephrectomy.

Aortic Atherosclerosis ([id number]).
